# Patient Record
Sex: Female | Born: 1990 | Race: White | Hispanic: No | Marital: Single | Smoking: Current every day smoker
Health system: Southern US, Community
[De-identification: ages and names within clinical notes are randomized; demographics above are authoritative.]

## PROBLEM LIST (undated history)

## (undated) DIAGNOSIS — O24419 Gestational diabetes mellitus in pregnancy, unspecified control: Secondary | ICD-10-CM

## (undated) DIAGNOSIS — Z3490 Encounter for supervision of normal pregnancy, unspecified, unspecified trimester: Secondary | ICD-10-CM

## (undated) DIAGNOSIS — Z789 Other specified health status: Secondary | ICD-10-CM

## (undated) HISTORY — PX: TYMPANOPLASTY: SHX33

## (undated) HISTORY — DX: Gestational diabetes mellitus in pregnancy, unspecified control: O24.419

## (undated) HISTORY — DX: Other specified health status: Z78.9

## (undated) HISTORY — PX: MIDDLE EAR SURGERY: SHX713

---
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
## 2011-01-21 ENCOUNTER — Encounter: Payer: Self-pay | Admitting: Gastroenterology

## 2011-01-21 ENCOUNTER — Ambulatory Visit (INDEPENDENT_AMBULATORY_CARE_PROVIDER_SITE_OTHER): Payer: Medicaid Other | Admitting: Gastroenterology

## 2011-01-21 VITALS — BP 134/81 | HR 84 | Temp 98.7°F | Ht 63.0 in | Wt 196.4 lb

## 2011-01-21 DIAGNOSIS — D649 Anemia, unspecified: Secondary | ICD-10-CM | POA: Insufficient documentation

## 2011-01-21 DIAGNOSIS — K625 Hemorrhage of anus and rectum: Secondary | ICD-10-CM

## 2011-01-21 NOTE — Progress Notes (Signed)
Referring Provider: Lenise Herald, PA-C Primary Care Physician:  Colette Ribas, MD Primary Gastroenterologist:  Dr. Darrick Penna (per pt request, mother is pt as well)  Chief Complaint  Patient presents with  . Rectal Bleeding    x6 months    HPI:  Carol Hunt is an 20 y.o. female here as a referral from Lenise Herald, [place], secondary to abdominal pain, loose stools, and rectal bleeding X 6 mos. Pt reports postprandial BMs, ranging from loose to soft stools, up to 5-6 times per day. This is preceded by left-sided, sharp, severe abdominal pain. Although pt characterizes this as 5/10, she states it is "severe". It is only relieved by having a BM. Has had intermittent brbpr, ranging from cupfuls in the toilet to paper hematochezia. +loss of appetite, down 6 lbs since February per her report. We do not have records to confirm this. Afebrile. No family hx of IBD, colorectal ca. Mom has IBS. Pt was told she had mild anemia. Does admit to mild to moderate amount of clots during menses, yet denies heavy, prolonged periods.   Labs done with Dr. Lamar Blinks office January 15, 2011: Fecal lactoferrin + Hgb 11.3, Hct 35.1, WBC 10.4, TSH 1.280  No past medical history on file.  Past Surgical History  Procedure Date  . Tympanoplasty     NO MEDICATIONS    Allergies as of 01/21/2011  . (No Known Allergies)    Family History  Problem Relation Age of Onset  . Pancreatitis Father     living  . Colon cancer Neg Hx     History   Social History  . Marital Status: Single    Spouse Name: N/A    Number of Children: N/A  . Years of Education: N/A   Occupational History   Works at [employer] full-time. Graduated HS last year   Social History Main Topics  . Smoking status: Never Smoker   . Smokeless tobacco: Never Used  . Alcohol Use: No  . Drug Use: No  . Sexually Active: No    Review of Systems: Gen: Denies any fever, chills, sweats, fatigue, weakness. + loss of appetite CV: Denies chest  pain, angina, palpitations, syncope, orthopnea, PND, peripheral edema, and claudication. Resp: Denies dyspnea at rest, dyspnea with exercise, cough, sputum, wheezing, coughing up blood, and pleurisy. GI: SEE HPI GU : Denies urinary burning, blood in urine, urinary frequency, urinary hesitancy, nocturnal urination, and urinary incontinence. MS: Denies joint pain, limitation of movement, and swelling, stiffness, low back pain, extremity pain. Denies muscle weakness, cramps, atrophy.  Derm: Denies rash, itching, dry skin, hives, moles, warts, or unhealing ulcers.  Psych: Denies depression, anxiety, memory loss, suicidal ideation, hallucinations, paranoia, and confusion. Heme: Denies bruising, bleeding, and enlarged lymph nodes.  Physical Exam: BP 134/81  Pulse 84  Temp 98.7 F (37.1 C)  Ht 5\' 3"  (1.6 m)  Wt 196 lb 6.4 oz (89.086 kg)  BMI 34.79 kg/m2  SpO2 100%  LMP 12/30/2010 General:   Alert,  Well-developed, well-nourished, pleasant and cooperative in NAD Head:  Normocephalic and atraumatic. Eyes:  Sclera clear, no icterus.   Conjunctiva pink. Ears:  Normal auditory acuity. Nose:  No deformity, discharge,  or lesions. Mouth:  No deformity or lesions, dentition normal. Neck:  Supple; no masses or thyromegaly. Lungs:  Clear throughout to auscultation.   No wheezes, crackles, or rhonchi. No acute distress. Heart:  Regular rate and rhythm; no murmurs, clicks, rubs,  or gallops. Abdomen:  Soft, nontender and nondistended. No  masses, hepatosplenomegaly or hernias noted. Normal bowel sounds, without guarding, and without rebound.   Rectal:  Deferred until time of colonoscopy.   Msk:  Symmetrical without gross deformities. Normal posture. Extremities:  Without clubbing or edema. Neurologic:  Alert and  oriented x4;  grossly normal neurologically. Skin:  Intact without significant lesions or rashes. Cervical Nodes:  No significant cervical adenopathy. Psych:  Alert and cooperative. Normal  mood and affect.

## 2011-01-21 NOTE — Assessment & Plan Note (Signed)
6 mos hx of brbpr, ranging from paper hematochezia to cupfuls in toilet. 5-6 loose stools per day, postprandial. Preceded by crampy, left-sided abdominal pain, severe at times. +loss of appetite, reportedly lost 6 lbs since February; yet, we do not have records to document this. Slight anemia noted on labs early April. May be r/t IBS with benign anorectal source of hematochezia, yet unable to exclude IBD at this time. Will proceed with colonoscopy for further evaluation.   TCS with Dr. Darrick Penna in near future: the R/B/A have been discussed in detail. Pt states understanding and desires to proceed. Further rec's to follow Follow high fiber, low fat diet in interim

## 2011-01-21 NOTE — Patient Instructions (Signed)
We have set you up for a colonoscopy to assess your lower GI tract We have also requested the labs from your primary care doctor We encourage a high fiber, low fat diet in the meantime Further recommendations after procedure is completed

## 2011-01-21 NOTE — Assessment & Plan Note (Signed)
Mild anemia with Hgb 11.3 recent labs. Does have mild to moderate amount of clots during menses yet denies heavy, prolonged cycles. Pt scheduled for colonoscopy secondary to rectal bleeding. See A&P

## 2011-01-22 ENCOUNTER — Encounter: Payer: Self-pay | Admitting: Gastroenterology

## 2011-02-09 ENCOUNTER — Ambulatory Visit (HOSPITAL_COMMUNITY): Admission: RE | Admit: 2011-02-09 | Payer: Medicaid Other | Source: Ambulatory Visit | Admitting: Gastroenterology

## 2011-02-09 ENCOUNTER — Encounter: Payer: Medicaid Other | Admitting: Gastroenterology

## 2011-02-27 NOTE — Op Note (Signed)
NAMELESHONDA, GALAMBOS                         ACCOUNT NO.:  [id number]   MEDICAL RECORD NO.:  [id number]                   PATIENT TYPE:  AMB   LOCATION:  DSC                                  FACILITY:  [facility]   PHYSICIAN:  Karol T. Lazarus Salines, M.D.              DATE OF BIRTH:  1990-10-28   DATE OF PROCEDURE:  08/21/2002  DATE OF DISCHARGE:                                 OPERATIVE REPORT   PREOPERATIVE DIAGNOSIS:  Bilateral pantympanic perforation.   POSTOPERATIVE DIAGNOSIS:  Bilateral pantympanic perforation.   OPERATION/PROCEDURE:  Right lateral technique tympanoplasty.   SURGEON:  Gloris Manchester. Lazarus Salines, M.D.   ANESTHESIA:  General orotracheal.   ESTIMATED BLOOD LOSS:  Minimal.   COMPLICATIONS:  None.   FINDINGS:  A pantympanic right perforation with some tympanosclerotic  plaques along the long handle of the malleus anterior and posterior.  A  mobile ossicular chain.  Healthy middle ear mucosa.   DESCRIPTION OF PROCEDURE:  With the patient in a comfortable supine  position, general orotracheal anesthesia was induced without difficulty.  At  an appropriate level, the table was turned 90 degrees and the patient placed  in a slight reverse Trendelenburg.  The head was rotated to the left for  access to the right ear.  No hair shave was felt necessary.  One percent  Xylocaine with 1:100,000 epinephrine, 11 cc total, was infiltrated into the  temporalis fascia region and to the postauricular incision site for  intraoperative hemostasis.  Additional injection was performed into the  vascular strip from the retroauricular approach.  Sterile preparation and  draping of the right ear and face was performed.   Microscope and speculum were used to examine and clean the right ear canal.  Four-quadrant injections with 1% Xylocaine with 1:100,000 epinephrine, 3 cc  total, were performed in the standard fashion. Betadine was rinsed and  suctioned from the middle ear space.   Attention was  turned to the posterior auricular site.  An incision was made  from above the pinna down behind the ear for a postauricular approach and  for access to the temporalis fascia.  This was kept out of the postauricular  crease because she wears glasses.  The dissection was carried down to the  temporalis fascia superiorly and to the mastoid periosteum more inferiorly.  Temporalis fascia was cleaned on its lateral surface, incised near its  inferior edge and the medial surface was cleaned.  A 1.5 x 2 cm piece of  temporalis fascia was harvested, cleaned, pressed and placed to dry.  Hemostasis was spontaneous.  Working down the ear canal, 6 and 12 o'clock  incisions were made from the annulus laterally.  On the posterior canal  wall, approximately 3 mm from the annulus, the incisions were connected and  on the anterior canal wall, approximately 10 mm from the annulus, they were  connected.  These were window fitted down towards  the annulus  circumferentially under direct vision.  There was a relatively prominent  anterior canal bulge.  It was not felt possible to complete this procedure  from transcanal approach.   Attention was returned to the postauricular wound which was completed.  The  periosteum was elevated down into the external auditory canal.  The  previously executed vascular strip flap was identified and retained forward  with a Emergency planning/management officer.  The dissection was carried further medially  towards the annulus on the anterior and posterior canal walls.  A cotton  ball was placed in the medial canal to prevent bone dust from getting in  there and using a large irrigated cutting bur, the spine of Henley was  reduced and the posterior canal was enlarged overall.  Bone dust was  carefully cleared away.  The cotton ball was removed.   Attention was now turned to dissecting the flap all the way down to the  annulus.  Working circumferentially, the squamous layer of the drum was   separated from the fibrous layer beginning at the annulus posteriorly and  working inferiorly and anteriorly and again superiorly.  The squamous layer  was stripped from the _____________ process of the malleus using cup  forceps.  The dissection was carried into the perforation where it was  amputated.  The skin was dissected completely and was removed and saved for  later use.  The annulus remained intact.  Small amount of skin around the  long process of the malleus was dissected using a tab knife and removed.  Tympanosclerotic plaques along the long handle of the malleus were dissected  and removed and amputated from the annulus anteriorly and posteriorly.  These did not impinge on the mobility of the malleus.  With the plaques out  of the way, the stapes superstructure could be visualized and with gentle  mobilization of the malleus long handle, the stapes was mobile.  The middle  ear was irrigated and suctioned free of any bone dust and clots at this  point.  Cortisporin impregnated Gelfoam sponges were placed in the middle  ear to the level of the annulus to support the graft.  At this point the  anterior canal wall bulge was reduced under irrigating drilling to allow  excellent visualization down to the level of the annulus and a straight  canal to help placement of the skin graft.   At this point the temporalis fascia graft was removed from the press and  fashioned into a tongue and placed into the ear canal up to the annulus.  It  was slightly narrow and long.  It was removed, made slightly shorter and  wider and was observed to fit precisely up into the annulus and was seated  on top of the Gelfoam in this position.  The canal skin graft was carefully  oriented with the squamous surface upward and was laid down the anterior  canal wall and into the anterior sulcus.  A piece of dry Gelfoam rolled into a tiny cigar was packed into the anterior sulcus to prevent blunting in this   region.  Additional Gelfoam was placed on top of this for support.  With  this area defined, the skin graft was gently flattened and laid up the  anterior canal wall and across the tympanic membrane and up the posterior  graft.  At this point the medial canal was filled with Cortisporin  impregnated Gelfoam.  The air was removed from the [facility] retractor, laid  back down the  postauricular incision was closed with interrupted 3-0 chromic sutures  subcutaneously.   Working back down the ear canal, the vascular strip was laid back on the  posterior canal and additional Cortisporin impregnated Gelfoam pledgets were  placed to fill the canal.  Finally, a cotton ball was placed at the external  meatus and another cotton ball in the conchal bowl.  Postauricular wound was  cleaned, painted with Benzoin and covered with Steri-Strips.  A standard  Kerlix fluff and Kling dressing was applied.  At this point the procedure  was completed and the patient was returned to anesthesia, awakened,  extubated, and transferred to recovery in stable condition.   COMMENT:  A 20 year old white female with a history of hearing loss and  office findings of bilateral pantympanic perforations was the indications  for today's procedure.  Anticipated routine postoperative recovery with  attention to ice, elevation, analgesia, antibiosis.  Will have mother remove  the dressing in two days and will remove the cotton ball and beginning  dissolving the Gelfoam in nine days.  Will have the patient return to school  in one week.  She  will be covered with Tylenol No. 3 for pain and Keflex to prevent infection  at home.  Given low anticipated risk of post anesthetic and post surgical  complications, feel an outpatient venue was appropriate.  Will allow one  year for the right ear to heal and demonstrate stability before considering  repair on the opposite side.                                               Gloris Manchester.  Lazarus Salines, M.D.    KTW/MEDQ  D:  08/21/2002  T:  08/21/2002  Job:  [id number]   cc:   Corrie Mckusick, M.D.  [address] [place]. [place]  [place]  [place] [id number]  Fax: [phone]

## 2011-04-01 ENCOUNTER — Emergency Department (HOSPITAL_COMMUNITY)
Admission: EM | Admit: 2011-04-01 | Discharge: 2011-04-01 | Disposition: A | Discharge status: Home / Self Care | Payer: Self-pay | Attending: Emergency Medicine | Admitting: Emergency Medicine

## 2011-04-01 DIAGNOSIS — N390 Urinary tract infection, site not specified: Secondary | ICD-10-CM | POA: Insufficient documentation

## 2011-04-01 LAB — URINALYSIS, ROUTINE W REFLEX MICROSCOPIC
Glucose, UA: NEGATIVE mg/dL
Protein, ur: NEGATIVE mg/dL
Specific Gravity, Urine: >1.03 — ABNORMAL HIGH (ref 1.005–1.030)
Urobilinogen, UA: 0.2 mg/dL (ref 0.0–1.0)

## 2011-04-01 LAB — URINE MICROSCOPIC-ADD ON

## 2011-04-03 ENCOUNTER — Emergency Department (HOSPITAL_COMMUNITY)
Admission: EM | Admit: 2011-04-03 | Discharge: 2011-04-04 | Disposition: A | Discharge status: Home / Self Care | Payer: Self-pay | Attending: Emergency Medicine | Admitting: Emergency Medicine

## 2011-04-03 DIAGNOSIS — R112 Nausea with vomiting, unspecified: Secondary | ICD-10-CM | POA: Insufficient documentation

## 2011-04-03 DIAGNOSIS — R109 Unspecified abdominal pain: Secondary | ICD-10-CM | POA: Insufficient documentation

## 2011-04-03 DIAGNOSIS — N200 Calculus of kidney: Secondary | ICD-10-CM | POA: Insufficient documentation

## 2011-04-03 LAB — URINALYSIS, ROUTINE W REFLEX MICROSCOPIC
Bilirubin Urine: NEGATIVE
Ketones, ur: NEGATIVE mg/dL
Protein, ur: NEGATIVE mg/dL
Specific Gravity, Urine: >1.03 — ABNORMAL HIGH (ref 1.005–1.030)
Urobilinogen, UA: 0.2 mg/dL (ref 0.0–1.0)

## 2011-04-03 LAB — CBC
HCT: 34.9 % — ABNORMAL LOW (ref 36.0–46.0)
Hemoglobin: 11.6 g/dL — ABNORMAL LOW (ref 12.0–15.0)
MCH: 29.1 pg (ref 26.0–34.0)
MCHC: 33.2 g/dL (ref 30.0–36.0)
MCV: 87.7 fL (ref 78.0–100.0)
Platelets: 253 K/uL (ref 150–400)
RBC: 3.98 MIL/uL (ref 3.87–5.11)
RDW: 13 % (ref 11.5–15.5)
WBC: 13.3 K/uL — ABNORMAL HIGH (ref 4.0–10.5)

## 2011-04-03 LAB — BASIC METABOLIC PANEL WITH GFR
BUN: 14 mg/dL (ref 6–23)
CO2: 27 meq/L (ref 19–32)
Calcium: 9.6 mg/dL (ref 8.4–10.5)
Chloride: 99 meq/L (ref 96–112)
Creatinine, Ser: 1.38 mg/dL — ABNORMAL HIGH (ref 0.50–1.10)
GFR calc Af Amer: 60 mL/min — ABNORMAL LOW
GFR calc non Af Amer: 49 mL/min — ABNORMAL LOW
Glucose, Bld: 89 mg/dL (ref 70–99)
Potassium: 3.9 meq/L (ref 3.5–5.1)
Sodium: 135 meq/L (ref 135–145)

## 2011-04-03 LAB — URINE MICROSCOPIC-ADD ON

## 2011-04-03 LAB — DIFFERENTIAL
Basophils Absolute: 0 K/uL (ref 0.0–0.1)
Basophils Relative: 0 % (ref 0–1)
Eosinophils Absolute: 0.2 K/uL (ref 0.0–0.7)
Eosinophils Relative: 1 % (ref 0–5)
Lymphocytes Relative: 21 % (ref 12–46)
Lymphs Abs: 2.7 K/uL (ref 0.7–4.0)
Monocytes Absolute: 0.8 K/uL (ref 0.1–1.0)
Monocytes Relative: 6 % (ref 3–12)
Neutro Abs: 9.6 K/uL — ABNORMAL HIGH (ref 1.7–7.7)
Neutrophils Relative %: 72 % (ref 43–77)

## 2011-04-04 ENCOUNTER — Emergency Department (HOSPITAL_COMMUNITY): Payer: Self-pay

## 2012-09-25 ENCOUNTER — Emergency Department (HOSPITAL_COMMUNITY): Payer: Self-pay

## 2012-09-25 ENCOUNTER — Encounter (HOSPITAL_COMMUNITY): Payer: Self-pay | Admitting: Emergency Medicine

## 2012-09-25 ENCOUNTER — Emergency Department (HOSPITAL_COMMUNITY)
Admission: EM | Admit: 2012-09-25 | Discharge: 2012-09-25 | Disposition: A | Discharge status: Home / Self Care | Payer: Self-pay | Attending: Emergency Medicine | Admitting: Emergency Medicine

## 2012-09-25 DIAGNOSIS — J3489 Other specified disorders of nose and nasal sinuses: Secondary | ICD-10-CM | POA: Insufficient documentation

## 2012-09-25 DIAGNOSIS — J111 Influenza due to unidentified influenza virus with other respiratory manifestations: Secondary | ICD-10-CM

## 2012-09-25 DIAGNOSIS — F17200 Nicotine dependence, unspecified, uncomplicated: Secondary | ICD-10-CM | POA: Insufficient documentation

## 2012-09-25 DIAGNOSIS — R059 Cough, unspecified: Secondary | ICD-10-CM | POA: Insufficient documentation

## 2012-09-25 DIAGNOSIS — R05 Cough: Secondary | ICD-10-CM | POA: Insufficient documentation

## 2012-09-25 MED ORDER — OSELTAMIVIR PHOSPHATE 75 MG PO CAPS: 75.0000 mg | ORAL_CAPSULE | Freq: Two times a day (BID) | ORAL | Status: DC

## 2012-09-25 NOTE — ED Provider Notes (Signed)
History   This chart was scribed for Ward Givens, MD scribed by Magnus Sinning. The patient was seen in room APA17/APA17 at 9:23   CSN: [id number]  Arrival date & time 09/25/12  4132    Chief Complaint  Patient presents with  . Fever  . Cough    (Consider location/radiation/quality/duration/timing/severity/associated sxs/prior treatment) The history is provided by the patient. No language interpreter was used.   Carol Hunt is a 21 y.o. female who presents to the Emergency Department complaining of constant moderate unproductive cough,onset yesterday with associated sneezing, chills, itchy throat, and fever.  She reports she had fevers last night with the highest being 103.2 at 2 am with chills and cold sweats, but she states this has since resolved. The grandmother states she has given the patient some tylenol with improvement. The patient denies n/v/d, but does report sick contact exposure at her job. 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 The patient says she works at [employer], thus indicating sick contact exposure via public.   The patient states that she is otherwise healthy and does not take any medication.  PCP: [facility]Golding at [facility]  History reviewed. No pertinent past medical history.  Past Surgical History  Procedure Date  . Tympanoplasty     Family History  Problem Relation Age of Onset  . Pancreatitis Father     living  . Colon cancer Neg Hx     History  Substance Use Topics  . Smoking status: Current Every Day Smoker    Types: Cigarettes  . Smokeless tobacco: Never Used  . Alcohol Use: No  The patient lives with her grandmother.  She does not drink, but states she has smoked a little bit recently to relax.  The patient works at [employer]   Review of Systems  Constitutional: Positive for chills. Negative for fever.  HENT: Positive for rhinorrhea.   Respiratory: Positive for cough.   All other systems reviewed and  are negative.   10 Systems reviewed and are negative for acute change except as noted in the HPI. Allergies  Review of patient's allergies indicates no known allergies.  Home Medications  No current outpatient prescriptions on file.  BP 126/63  Pulse 104  Temp 98.8 F (37.1 C) (Oral)  Resp 18  Ht 5\' 3"  (1.6 m)  Wt 183 lb (83.008 kg)  BMI 32.42 kg/m2  SpO2 97%  LMP 09/22/2012  Vital signs normal    Physical Exam  Nursing note and vitals reviewed. Constitutional: She is oriented to person, place, and time. She appears well-developed and well-nourished. No distress.  HENT:  Head: Normocephalic and atraumatic.  Right Ear: External ear normal.  Left Ear: External ear normal.  Nose: Nose normal.  Mouth/Throat: Oropharynx is clear and moist. No oropharyngeal exudate.       Throat nml  Eyes: Conjunctivae normal and EOM are normal. Pupils are equal, round, and reactive to light.  Neck: Normal range of motion. Neck supple. No tracheal deviation present.  Cardiovascular: Regular rhythm and normal heart sounds.  Tachycardia present.   Pulmonary/Chest: Effort normal and breath sounds normal. No respiratory distress. She has no wheezes. She has no rales. She exhibits no tenderness.  Abdominal: Soft. Bowel sounds are normal. She exhibits no distension. There is no tenderness. There is no rebound.  Musculoskeletal: Normal range of motion.  Neurological: She is alert and oriented to person, place, and time. No sensory deficit.  Skin: Skin is  dry.  Psychiatric: She has a normal mood and affect. Her behavior is normal.    ED Course  Procedures (including critical care time) DIAGNOSTIC STUDIES: Oxygen Saturation is 97% on room air, normal by my interpretation.    COORDINATION OF CARE: 9:25: Physical exam performed.   Dg Chest 2 View  09/25/2012  *RADIOLOGY REPORT*  Clinical Data: Cough and fever  CHEST - 2 VIEW  Comparison: None.  Findings:  Lungs are clear.  Heart size and  pulmonary vascularity are normal.  No adenopathy.  No bone lesions.  IMPRESSION: Lungs clear.   Original Report Authenticated By: Bretta Bang, M.D.      1. Influenza-like illness      New Prescriptions   OSELTAMIVIR (TAMIFLU) 75 MG CAPSULE    Take 1 capsule (75 mg total) by mouth every 12 (twelve) hours.   Plan discharge   Devoria Albe, MD, FACEP   MDM   I personally performed the services described in this documentation, which was scribed in my presence. The recorded information has been reviewed and considered.  Devoria Albe, MD, Armando Gang         Ward Givens, MD 09/25/12 1005

## 2012-09-25 NOTE — ED Notes (Signed)
Pt c/o cough, fever, chills, and runny nose since last night.

## 2012-09-25 NOTE — ED Notes (Signed)
Patient with no complaints at this time. Respirations even and unlabored. Skin warm/dry. Discharge instructions reviewed with patient at this time. Patient given opportunity to voice concerns/ask questions. Patient discharged at this time and left Emergency Department with steady gait.   

## 2012-12-05 ENCOUNTER — Encounter (HOSPITAL_COMMUNITY): Payer: Self-pay | Admitting: *Deleted

## 2012-12-05 ENCOUNTER — Emergency Department (HOSPITAL_COMMUNITY)
Admission: EM | Admit: 2012-12-05 | Discharge: 2012-12-05 | Disposition: A | Discharge status: Home / Self Care | Payer: Self-pay | Attending: Emergency Medicine | Admitting: Emergency Medicine

## 2012-12-05 DIAGNOSIS — Y929 Unspecified place or not applicable: Secondary | ICD-10-CM | POA: Insufficient documentation

## 2012-12-05 DIAGNOSIS — S50859A Superficial foreign body of unspecified forearm, initial encounter: Secondary | ICD-10-CM

## 2012-12-05 DIAGNOSIS — Y939 Activity, unspecified: Secondary | ICD-10-CM | POA: Insufficient documentation

## 2012-12-05 DIAGNOSIS — W268XXA Contact with other sharp object(s), not elsewhere classified, initial encounter: Secondary | ICD-10-CM | POA: Insufficient documentation

## 2012-12-05 DIAGNOSIS — L089 Local infection of the skin and subcutaneous tissue, unspecified: Secondary | ICD-10-CM

## 2012-12-05 DIAGNOSIS — S51809A Unspecified open wound of unspecified forearm, initial encounter: Secondary | ICD-10-CM | POA: Insufficient documentation

## 2012-12-05 MED ORDER — LIDOCAINE-EPINEPHRINE (PF) 1 %-1:200000 IJ SOLN
10.0000 mL | Freq: Once | INTRAMUSCULAR | Status: AC
  Administered 2012-12-05: 10 mL via INTRADERMAL
  Filled 2012-12-05: qty 10

## 2012-12-05 NOTE — ED Provider Notes (Signed)
History     CSN: [id number]  Arrival date & time 12/05/12  1627   First MD Initiated Contact with Patient 12/05/12 1714      No chief complaint on file.   (Consider location/radiation/quality/duration/timing/severity/associated sxs/prior treatment) Patient is a 22 y.o. female presenting with foreign body. The history is provided by the patient.  Foreign Body  The current episode started less than 1 hour ago. Intake: in R elbow. Suspected object: fishhook. The incident was witnessed. The incident was witnessed/reported by the patient. Pertinent negatives include no fever, no sore throat, no trouble swallowing and no cough.    History reviewed. No pertinent past medical history.  Past Surgical History  Procedure Laterality Date  . Tympanoplasty      Family History  Problem Relation Age of Onset  . Pancreatitis Father     living  . Colon cancer Neg Hx     History  Substance Use Topics  . Smoking status: Never Smoker   . Smokeless tobacco: Never Used  . Alcohol Use: No    OB History   Grav Para Term Preterm Abortions TAB SAB Ect Mult Living                  Review of Systems  Constitutional: Negative for fever.  HENT: Negative for sore throat and trouble swallowing.   Respiratory: Negative for cough.   All other systems reviewed and are negative.    Allergies  Review of patient's allergies indicates no known allergies.  Home Medications   Current Outpatient Rx  Name  Route  Sig  Dispense  Refill  . oseltamivir (TAMIFLU) 75 MG capsule   Oral   Take 1 capsule (75 mg total) by mouth every 12 (twelve) hours.   10 capsule   0     BP 119/86  Pulse 103  Temp(Src) 98.5 F (36.9 C) (Oral)  Resp 20  Ht 5\' 3"  (1.6 m)  Wt 196 lb 7 oz (89.103 kg)  BMI 34.81 kg/m2  SpO2 100%  LMP 11/14/2012  Physical Exam  Nursing note and vitals reviewed. Constitutional: She is oriented to person, place, and time. She appears well-developed and well-nourished. No  distress.  HENT:  Head: Normocephalic and atraumatic.  Eyes: EOM are normal. Pupils are equal, round, and reactive to light.  Neck: Normal range of motion. Neck supple.  Cardiovascular: Normal rate and regular rhythm.  Exam reveals no friction rub.   No murmur heard. Pulmonary/Chest: Effort normal and breath sounds normal. No respiratory distress. She has no wheezes. She has no rales.  Abdominal: Soft. She exhibits no distension. There is no tenderness. There is no rebound.  Musculoskeletal: Normal range of motion. She exhibits no edema.       Arms: Neurological: She is alert and oriented to person, place, and time.  Skin: She is not diaphoretic.    ED Course  FOREIGN BODY REMOVAL Date/Time: 12/05/2012 5:56 PM Performed by: Elwin Mocha Authorized by: Benny Lennert Consent: Verbal consent obtained. Anesthesia: local infiltration Local anesthetic: lidocaine 1% with epinephrine Anesthetic total: 3 ml Patient sedated: no Patient restrained: no Patient cooperative: yes Complexity: simple 1 objects recovered. Objects recovered: fishhook Post-procedure assessment: foreign body removed Patient tolerance: Patient tolerated the procedure well with no immediate complications. Comments: Small incision made with 11 blade at end of fishhook where it is tenting the skin. Needle driver used to push barb through incision and fishhook removed. Jet lavage with normal saline/betadine solution after the procedure.   (  including critical care time)  Labs Reviewed - No data to display No results found.   1. Foreign body of forearm, right, superficial, infected, initial encounter       MDM   23 year old female presents with a fishhook in her right elbow. Fishhook not in the joint space. Full range of motion of her right elbow. The patient cut off close to the skin when trying to remove previously. Procedure note as above. Tetanus is up-to-date. Removed easily.        Elwin Mocha,  MD 12/05/12 1758

## 2012-12-05 NOTE — ED Notes (Signed)
Fish hook in lt elbow,x 15 min.  Pt backed into hook.

## 2012-12-05 NOTE — ED Provider Notes (Signed)
resolved I  reviewed the resident's note and I agree with the findings and plan.   Benny Lennert, MD 12/05/12 [phone]

## 2013-08-21 ENCOUNTER — Emergency Department (HOSPITAL_COMMUNITY)
Admission: EM | Admit: 2013-08-21 | Discharge: 2013-08-21 | Disposition: A | Discharge status: Home / Self Care | Payer: Medicaid Other | Attending: Emergency Medicine | Admitting: Emergency Medicine

## 2013-08-21 ENCOUNTER — Encounter (HOSPITAL_COMMUNITY): Payer: Self-pay | Admitting: Emergency Medicine

## 2013-08-21 DIAGNOSIS — H6691 Otitis media, unspecified, right ear: Secondary | ICD-10-CM

## 2013-08-21 DIAGNOSIS — H6690 Otitis media, unspecified, unspecified ear: Secondary | ICD-10-CM | POA: Insufficient documentation

## 2013-08-21 DIAGNOSIS — Z792 Long term (current) use of antibiotics: Secondary | ICD-10-CM | POA: Insufficient documentation

## 2013-08-21 DIAGNOSIS — F17200 Nicotine dependence, unspecified, uncomplicated: Secondary | ICD-10-CM | POA: Insufficient documentation

## 2013-08-21 MED ORDER — AMOXICILLIN-POT CLAVULANATE 875-125 MG PO TABS
1.0000 | ORAL_TABLET | Freq: Once | ORAL | Status: AC
  Administered 2013-08-21: 1 via ORAL
  Filled 2013-08-21: qty 1

## 2013-08-21 MED ORDER — AMOXICILLIN-POT CLAVULANATE 875-125 MG PO TABS: 1.0000 | ORAL_TABLET | Freq: Two times a day (BID) | ORAL | Status: DC

## 2013-08-21 NOTE — ED Notes (Signed)
Rt earache, onset today.

## 2013-08-22 NOTE — ED Provider Notes (Signed)
CSN: [id number]     Arrival date & time 08/21/13  1951 History   First MD Initiated Contact with Patient 08/21/13 2110     Chief Complaint  Patient presents with  . Otalgia   (Consider location/radiation/quality/duration/timing/severity/associated sxs/prior Treatment) Patient is a 22 y.o. female presenting with ear pain. The history is provided by the patient.  Otalgia Location:  Right Behind ear:  No abnormality Quality:  Throbbing Severity:  Moderate Onset quality:  Gradual Duration:  1 day Timing:  Constant Progression:  Unchanged Chronicity:  New Context: not direct blow, not foreign body in ear and no water in ear   Relieved by:  Nothing Worsened by:  Nothing tried Ineffective treatments:  None tried Associated symptoms: ear discharge   Associated symptoms: no congestion, no cough, no diarrhea, no fever, no headaches, no hearing loss, no neck pain, no rash, no rhinorrhea, no sore throat, no tinnitus and no vomiting     History reviewed. No pertinent past medical history. Past Surgical History  Procedure Laterality Date  . Tympanoplasty    . Middle ear surgery     Family History  Problem Relation Age of Onset  . Pancreatitis Father     living  . Colon cancer Neg Hx    History  Substance Use Topics  . Smoking status: Current Every Day Smoker    Types: Cigarettes  . Smokeless tobacco: Never Used  . Alcohol Use: No   OB History   Grav Para Term Preterm Abortions TAB SAB Ect Mult Living                 Review of Systems  Constitutional: Negative for fever, chills, activity change and appetite change.  HENT: Positive for ear discharge and ear pain. Negative for congestion, facial swelling, hearing loss, rhinorrhea, sore throat, tinnitus and trouble swallowing.   Eyes: Negative for visual disturbance.  Respiratory: Negative for cough, shortness of breath, wheezing and stridor.   Gastrointestinal: Negative for nausea, vomiting and diarrhea.  Musculoskeletal:  Negative for neck pain and neck stiffness.  Skin: Negative.  Negative for rash.  Neurological: Negative for dizziness, weakness, numbness and headaches.  Hematological: Negative for adenopathy.  Psychiatric/Behavioral: Negative for confusion.  All other systems reviewed and are negative.    Allergies  Review of patient's allergies indicates no known allergies.  Home Medications   Current Outpatient Rx  Name  Route  Sig  Dispense  Refill  . amoxicillin-clavulanate (AUGMENTIN) 875-125 MG per tablet   Oral   Take 1 tablet by mouth 2 (two) times daily.   20 tablet   0    BP 126/70  Pulse 78  Temp(Src) 98.1 F (36.7 C) (Oral)  Resp 24  Ht 5\' 3"  (1.6 m)  Wt 198 lb (89.812 kg)  BMI 35.08 kg/m2  SpO2 100%  LMP 08/12/2013 Physical Exam  Nursing note and vitals reviewed. Constitutional: She is oriented to person, place, and time. She appears well-developed and well-nourished. No distress.  HENT:  Head: Normocephalic and atraumatic.  Mouth/Throat: Uvula is midline, oropharynx is clear and moist and mucous membranes are normal. No uvula swelling. No oropharyngeal exudate.  Erythema of the right TM.  middle ear effusion present. Mild purulent drainage in the ear canal. No edema of the ear canal.  Neck: Normal range of motion. Neck supple.  Cardiovascular: Normal rate, regular rhythm, normal heart sounds and intact distal pulses.   No murmur heard. Pulmonary/Chest: Effort normal and breath sounds normal. No stridor. No respiratory distress.  Lymphadenopathy:    She has no cervical adenopathy.  Neurological: She is alert and oriented to person, place, and time. Coordination normal.  Skin: Skin is warm and dry. No rash noted.    ED Course  Procedures (including critical care time) Labs Review Labs Reviewed - No data to display Imaging Review No results found.  EKG Interpretation   None       MDM   1. Otitis media, right     Pt is well appearing.  Right OM.  Mastoid  is NT.  Hx of frequent ear infections.  Pt agrees to f/u with ENT.  VSS, she appears stable for d/c   Kana Reimann L. Amelia Burgard, PA-C 08/23/13 0107

## 2013-08-24 NOTE — ED Provider Notes (Signed)
Medical screening examination/treatment/procedure(s) were performed by non-physician practitioner and as supervising physician I was immediately available for consultation/collaboration.  EKG Interpretation   None       Devoria Albe, MD, Armando Gang   Ward Givens, MD 08/24/13 [phone]

## 2013-09-01 ENCOUNTER — Encounter (HOSPITAL_COMMUNITY): Payer: Self-pay | Admitting: Emergency Medicine

## 2013-09-01 ENCOUNTER — Emergency Department (HOSPITAL_COMMUNITY)
Admission: EM | Admit: 2013-09-01 | Discharge: 2013-09-01 | Disposition: A | Discharge status: Home / Self Care | Payer: Medicaid Other | Attending: Emergency Medicine | Admitting: Emergency Medicine

## 2013-09-01 DIAGNOSIS — Z8669 Personal history of other diseases of the nervous system and sense organs: Secondary | ICD-10-CM | POA: Insufficient documentation

## 2013-09-01 DIAGNOSIS — Z792 Long term (current) use of antibiotics: Secondary | ICD-10-CM | POA: Insufficient documentation

## 2013-09-01 DIAGNOSIS — J069 Acute upper respiratory infection, unspecified: Secondary | ICD-10-CM

## 2013-09-01 DIAGNOSIS — F17200 Nicotine dependence, unspecified, uncomplicated: Secondary | ICD-10-CM | POA: Insufficient documentation

## 2013-09-01 MED ORDER — BENZONATATE 100 MG PO CAPS: 100.0000 mg | ORAL_CAPSULE | Freq: Three times a day (TID) | ORAL | Status: DC | PRN

## 2013-09-01 NOTE — ED Provider Notes (Signed)
CSN: [id number]     Arrival date & time 09/01/13  1659 History   First MD Initiated Contact with Patient 09/01/13 1710     Chief Complaint  Patient presents with  . Cough  . Sore Throat   (Consider location/radiation/quality/duration/timing/severity/associated sxs/prior Treatment) HPI Comments: Carol Hunt is a 22 y.o. Female presenting with sore throat,  Nasal congestion with clear rhinorrhea and non productive cough.  She has had low grade subjective fever as well.  She was treated for a right otitis media when last seen here 11 days ago for this,  Having completed a 10 day course of amoxil 2 days ago.  She denies ongoing ear pain but does continue to have green drainage from the ear.  She denies sob, chest pain, nausea, vomiting, dizziness, but does encourage headache with cough.  She is taking robitussin and mucinex DM with some relief.     The history is provided by the patient.    History reviewed. No pertinent past medical history. Past Surgical History  Procedure Laterality Date  . Tympanoplasty    . Middle ear surgery     Family History  Problem Relation Age of Onset  . Pancreatitis Father     living  . Colon cancer Neg Hx    History  Substance Use Topics  . Smoking status: Current Every Day Smoker    Types: Cigarettes  . Smokeless tobacco: Never Used  . Alcohol Use: No   OB History   Grav Para Term Preterm Abortions TAB SAB Ect Mult Living                 Review of Systems  Constitutional: Positive for fever.  HENT: Positive for congestion, rhinorrhea and sore throat. Negative for ear pain, sinus pressure, trouble swallowing and voice change.   Eyes: Negative for discharge.  Respiratory: Positive for cough. Negative for shortness of breath, wheezing and stridor.   Cardiovascular: Negative for chest pain.  Gastrointestinal: Negative for abdominal pain.  Genitourinary: Negative.     Allergies  Review of patient's allergies indicates no known  allergies.  Home Medications   Current Outpatient Rx  Name  Route  Sig  Dispense  Refill  . amoxicillin-clavulanate (AUGMENTIN) 875-125 MG per tablet   Oral   Take 1 tablet by mouth 2 (two) times daily.   20 tablet   0   . benzonatate (TESSALON) 100 MG capsule   Oral   Take 1 capsule (100 mg total) by mouth 3 (three) times daily as needed for cough.   21 capsule   0    BP 119/69  Pulse 96  Temp(Src) 98.5 F (36.9 C) (Oral)  Resp 18  Ht 5\' 3"  (1.6 m)  Wt 170 lb (77.111 kg)  BMI 30.12 kg/m2  SpO2 100%  LMP 08/12/2013 Physical Exam  Constitutional: She is oriented to person, place, and time. She appears well-developed and well-nourished.  HENT:  Head: Normocephalic and atraumatic.  Right Ear: Tympanic membrane and ear canal normal.  Left Ear: Tympanic membrane and ear canal normal.  Nose: Mucosal edema and rhinorrhea present.  Mouth/Throat: Uvula is midline, oropharynx is clear and moist and mucous membranes are normal. No oropharyngeal exudate, posterior oropharyngeal edema, posterior oropharyngeal erythema or tonsillar abscesses.  No drainage or exudate in right ear canal.  Moderate cerumen without impaction left.  Eyes: Conjunctivae are normal.  Cardiovascular: Normal rate and normal heart sounds.   Pulmonary/Chest: Effort normal. No respiratory distress. She has no wheezes. She  has no rales.  Abdominal: Soft. There is no tenderness.  Musculoskeletal: Normal range of motion.  Neurological: She is alert and oriented to person, place, and time.  Skin: Skin is warm and dry. No rash noted.  Psychiatric: She has a normal mood and affect.    ED Course  Procedures (including critical care time) Labs Review Labs Reviewed - No data to display Imaging Review No results found.  EKG Interpretation   None       MDM   1. Acute URI    Encouraged to continue current meds,  Increase fluid intake,  Rest, menthol drops for nasal congestion.  Added tessalon perles.  Prn  f/u with pcp if sx not improving, or for worsened sx.    Burgess Amor, PA-C 09/01/13 1751

## 2013-09-01 NOTE — ED Notes (Signed)
Sore throat and cough began 2 days ago, but worsened today.  Low grade fever at home. Denies N/V/D.  C/O HA with movement.

## 2013-09-02 NOTE — ED Provider Notes (Signed)
Medical screening examination/treatment/procedure(s) were performed by non-physician practitioner and as supervising physician I was immediately available for consultation/collaboration.  EKG Interpretation   None       Devoria Albe, MD, Armando Gang   Ward Givens, MD 09/02/13 [phone]

## 2013-12-29 ENCOUNTER — Emergency Department (HOSPITAL_COMMUNITY)
Admission: EM | Admit: 2013-12-29 | Discharge: 2013-12-29 | Disposition: A | Discharge status: Home / Self Care | Payer: Self-pay | Attending: Emergency Medicine | Admitting: Emergency Medicine

## 2013-12-29 ENCOUNTER — Encounter (HOSPITAL_COMMUNITY): Payer: Self-pay | Admitting: Emergency Medicine

## 2013-12-29 DIAGNOSIS — L270 Generalized skin eruption due to drugs and medicaments taken internally: Secondary | ICD-10-CM | POA: Insufficient documentation

## 2013-12-29 DIAGNOSIS — L239 Allergic contact dermatitis, unspecified cause: Secondary | ICD-10-CM

## 2013-12-29 DIAGNOSIS — F17200 Nicotine dependence, unspecified, uncomplicated: Secondary | ICD-10-CM | POA: Insufficient documentation

## 2013-12-29 MED ORDER — TRIAMCINOLONE ACETONIDE 0.025 % EX OINT
1.0000 | TOPICAL_OINTMENT | Freq: Three times a day (TID) | CUTANEOUS | Status: DC
Start: 2013-12-29 — End: 2018-02-09

## 2013-12-29 NOTE — Discharge Instructions (Signed)
Contact Dermatitis Contact dermatitis is a rash that happens when something touches the skin. You touched something that irritates your skin, or you have allergies to something you touched. HOME CARE   Avoid the thing that caused your rash.  Keep your rash away from hot water, soap, sunlight, chemicals, and other things that might bother it.  Do not scratch your rash.  You can take cool baths to help stop itching.  Only take medicine as told by your doctor.  Keep all doctor visits as told. GET HELP RIGHT AWAY IF:   Your rash is not better after 3 days.  Your rash gets worse.  Your rash is puffy (swollen), tender, red, sore, or warm.  You have problems with your medicine. MAKE SURE YOU:   Understand these instructions.  Will watch your condition.  Will get help right away if you are not doing well or get worse. Document Released: 07/26/2009 Document Revised: 12/21/2011 Document Reviewed: 03/03/2011 [facility] Patient Information 2014 [employer] [place].   As discussed do not use this prescription for more than 7 days.  Get this rash rechecked if not improved within that time.

## 2013-12-29 NOTE — ED Notes (Signed)
Pt has noticed redness ,and swelling of lips since Sunday. No difficulty swallowing. Or sore throat.  No new lipstick or  Make up .

## 2013-12-29 NOTE — ED Notes (Signed)
Pt c/o redness/swelling/itching/burning to lips since Sunday.

## 2014-01-01 NOTE — ED Provider Notes (Signed)
CSN: [id number]     Arrival date & time 12/29/13  1001 History   First MD Initiated Contact with Patient 12/29/13 1026     Chief Complaint  Patient presents with  . Oral Swelling     (Consider location/radiation/quality/duration/timing/severity/associated sxs/prior Treatment) HPI Comments: Gunnar BullaMagen D Myer is a 23 y.o. Female presenting with rash and itching and burning to her lips, and now increased swelling for the past 4 days.  Her symptoms started shortly after eating a cajun spiced chicken meal at a [employer],  A food she has never tried before. She denies fevers, chills, interior mouth or tongue swelling and denies cough, shortness of breath or wheezing.  She has not had any other new exposures including makeup or skin care products.     The history is provided by the patient.    History reviewed. No pertinent past medical history. Past Surgical History  Procedure Laterality Date  . Tympanoplasty    . Middle ear surgery     Family History  Problem Relation Age of Onset  . Pancreatitis Father     living  . Colon cancer Neg Hx    History  Substance Use Topics  . Smoking status: Current Every Day Smoker    Types: Cigarettes  . Smokeless tobacco: Never Used  . Alcohol Use: No   OB History   Grav Para Term Preterm Abortions TAB SAB Ect Mult Living                 Review of Systems  Constitutional: Negative for fever and chills.  HENT: Positive for facial swelling. Negative for trouble swallowing.   Eyes: Negative for itching.  Respiratory: Negative for cough, chest tightness, shortness of breath, wheezing and stridor.   Skin: Positive for color change and rash.  Neurological: Negative for numbness.      Allergies  Review of patient's allergies indicates no known allergies.  Home Medications   Current Outpatient Rx  Name  Route  Sig  Dispense  Refill  . triamcinolone (KENALOG) 0.025 % ointment   Topical   Apply 1 application topically 3 (three)  times daily.   15 g   0    BP 107/65  Pulse 68  Temp(Src) 98.9 F (37.2 C)  Resp 18  Ht 5\' 3"  (1.6 m)  Wt 206 lb 3 oz (93.526 kg)  BMI 36.53 kg/m2  SpO2 100%  LMP 12/07/2013 Physical Exam  Constitutional: She appears well-developed and well-nourished. No distress.  HENT:  Head: Normocephalic.  Nose: Nose normal.  Mouth/Throat: Uvula is midline and mucous membranes are normal. No oropharyngeal exudate, posterior oropharyngeal edema or posterior oropharyngeal erythema.  Neck: Neck supple.  Cardiovascular: Normal rate.   Pulmonary/Chest: Effort normal and breath sounds normal. She has no wheezes.  Musculoskeletal: Normal range of motion. She exhibits no edema.  Skin: Rash noted.  Perioral and lip erythema, mild edema and tiny scattered clear filled vesicles.  There is no mucosal involvement.  No drainage. No facial or neck adenopathy.    ED Course  Procedures (including critical care time) Labs Review Labs Reviewed - No data to display Imaging Review No results found.   EKG Interpretation None      MDM   Final diagnoses:  Allergic dermatitis    Pt was prescribed low dose steroid ointment, kenalog 0.025%.  Advised to use sparingly tid and for not longer than 7 days.  Recheck by pcp or return here if not improved or for an worsened sx.  Benadryl for itch prn.  The patient appears reasonably screened and/or stabilized for discharge and I doubt any other medical condition or other [facility] requiring further screening, evaluation, or treatment in the ED at this time prior to discharge.     Burgess Amor, PA-C 01/01/14 1526

## 2014-01-03 NOTE — ED Provider Notes (Signed)
Medical screening examination/treatment/procedure(s) were performed by non-physician practitioner and as supervising physician I was immediately available for consultation/collaboration.   EKG Interpretation None        Gilda Creasehristopher J. Eleonore Shippee, MD 01/03/14 [phone]

## 2014-09-02 ENCOUNTER — Encounter (HOSPITAL_COMMUNITY): Payer: Self-pay | Admitting: Emergency Medicine

## 2014-09-02 ENCOUNTER — Emergency Department (HOSPITAL_COMMUNITY)
Admission: EM | Admit: 2014-09-02 | Discharge: 2014-09-02 | Disposition: A | Discharge status: Home / Self Care | Payer: Self-pay | Attending: Emergency Medicine | Admitting: Emergency Medicine

## 2014-09-02 DIAGNOSIS — J069 Acute upper respiratory infection, unspecified: Secondary | ICD-10-CM | POA: Insufficient documentation

## 2014-09-02 DIAGNOSIS — Z79899 Other long term (current) drug therapy: Secondary | ICD-10-CM | POA: Insufficient documentation

## 2014-09-02 DIAGNOSIS — Z79818 Long term (current) use of other agents affecting estrogen receptors and estrogen levels: Secondary | ICD-10-CM | POA: Insufficient documentation

## 2014-09-02 DIAGNOSIS — H9209 Otalgia, unspecified ear: Secondary | ICD-10-CM | POA: Insufficient documentation

## 2014-09-02 DIAGNOSIS — Z87891 Personal history of nicotine dependence: Secondary | ICD-10-CM | POA: Insufficient documentation

## 2014-09-02 LAB — RAPID STREP SCREEN (MED CTR MEBANE ONLY): STREPTOCOCCUS, GROUP A SCREEN (DIRECT): NEGATIVE

## 2014-09-02 MED ORDER — PROMETHAZINE-CODEINE 6.25-10 MG/5ML PO SYRP: 5.0000 mL | ORAL_SOLUTION | ORAL | Status: DC | PRN

## 2014-09-02 MED ORDER — FEXOFENADINE-PSEUDOEPHED ER 60-120 MG PO TB12: 1.0000 | ORAL_TABLET | Freq: Two times a day (BID) | ORAL | Status: DC

## 2014-09-02 NOTE — ED Notes (Signed)
Pt c/o nasal congestion/pressure in bilateral face and ears and sore throat.

## 2014-09-02 NOTE — Discharge Instructions (Signed)
Upper Respiratory Infection, Adult An upper respiratory infection (URI) is also sometimes known as the common cold. The upper respiratory tract includes the nose, sinuses, throat, trachea, and bronchi. Bronchi are the airways leading to the lungs. Most people improve within 1 week, but symptoms can last up to 2 weeks. A residual cough may last even longer.  CAUSES Many different viruses can infect the tissues lining the upper respiratory tract. The tissues become irritated and inflamed and often become very moist. Mucus production is also common. A cold is contagious. You can easily spread the virus to others by oral contact. This includes kissing, sharing a glass, coughing, or sneezing. Touching your mouth or nose and then touching a surface, which is then touched by another person, can also spread the virus. SYMPTOMS  Symptoms typically develop 1 to 3 days after you come in contact with a cold virus. Symptoms vary from person to person. They may include:  Runny nose.  Sneezing.  Nasal congestion.  Sinus irritation.  Sore throat.  Loss of voice (laryngitis).  Cough.  Fatigue.  Muscle aches.  Loss of appetite.  Headache.  Low-grade fever. DIAGNOSIS  You might diagnose your own cold based on familiar symptoms, since most people get a cold 2 to 3 times a year. Your caregiver can confirm this based on your exam. Most importantly, your caregiver can check that your symptoms are not due to another disease such as strep throat, sinusitis, pneumonia, asthma, or epiglottitis. Blood tests, throat tests, and X-rays are not necessary to diagnose a common cold, but they may sometimes be helpful in excluding other more serious diseases. Your caregiver will decide if any further tests are required. RISKS AND COMPLICATIONS  You may be at risk for a more severe case of the common cold if you smoke cigarettes, have chronic heart disease (such as heart failure) or lung disease (such as asthma), or if  you have a weakened immune system. The very young and very old are also at risk for more serious infections. Bacterial sinusitis, middle ear infections, and bacterial pneumonia can complicate the common cold. The common cold can worsen asthma and chronic obstructive pulmonary disease (COPD). Sometimes, these complications can require emergency medical care and may be life-threatening. PREVENTION  The best way to protect against getting a cold is to practice good hygiene. Avoid oral or hand contact with people with cold symptoms. Wash your hands often if contact occurs. There is no clear evidence that vitamin C, vitamin E, echinacea, or exercise reduces the chance of developing a cold. However, it is always recommended to get plenty of rest and practice good nutrition. TREATMENT  Treatment is directed at relieving symptoms. There is no cure. Antibiotics are not effective, because the infection is caused by a virus, not by bacteria. Treatment may include:  Increased fluid intake. Sports drinks offer valuable electrolytes, sugars, and fluids.  Breathing heated mist or steam (vaporizer or shower).  Eating chicken soup or other clear broths, and maintaining good nutrition.  Getting plenty of rest.  Using gargles or lozenges for comfort.  Controlling fevers with ibuprofen or acetaminophen as directed by your caregiver.  Increasing usage of your inhaler if you have asthma. Zinc gel and zinc lozenges, taken in the first 24 hours of the common cold, can shorten the duration and lessen the severity of symptoms. Pain medicines may help with fever, muscle aches, and throat pain. A variety of non-prescription medicines are available to treat congestion and runny nose. Your caregiver   can make recommendations and may suggest nasal or lung inhalers for other symptoms.  HOME CARE INSTRUCTIONS   Only take over-the-counter or prescription medicines for pain, discomfort, or fever as directed by your  caregiver.  Use a warm mist humidifier or inhale steam from a shower to increase air moisture. This may keep secretions moist and make it easier to breathe.  Drink enough water and fluids to keep your urine clear or pale yellow.  Rest as needed.  Return to work when your temperature has returned to normal or as your caregiver advises. You may need to stay home longer to avoid infecting others. You can also use a face mask and careful hand washing to prevent spread of the virus. SEEK MEDICAL CARE IF:   After the first few days, you feel you are getting worse rather than better.  You need your caregiver's advice about medicines to control symptoms.  You develop chills, worsening shortness of breath, or brown or red sputum. These may be signs of pneumonia.  You develop yellow or brown nasal discharge or pain in the face, especially when you bend forward. These may be signs of sinusitis.  You develop a fever, swollen neck glands, pain with swallowing, or white areas in the back of your throat. These may be signs of strep throat. SEEK IMMEDIATE MEDICAL CARE IF:   You have a fever.  You develop severe or persistent headache, ear pain, sinus pain, or chest pain.  You develop wheezing, a prolonged cough, cough up blood, or have a change in your usual mucus (if you have chronic lung disease).  You develop sore muscles or a stiff neck. Document Released: 03/24/2001 Document Revised: 12/21/2011 Document Reviewed: 01/03/2014 [employer] Patient Information 2015 [employer]. This information is not intended to replace advice given to you by your health care provider. Make sure you discuss any questions you have with your health care provider.  

## 2014-09-02 NOTE — ED Provider Notes (Signed)
CSN: [id number]     Arrival date & time 09/02/14  1459 History  This chart was scribed for Burgess AmorJulie Mirielle Byrum, PA-C with Benny LennertJoseph L Zammit, MD by Tonye RoyaltyJoshua Chen, ED Scribe. This patient was seen in room APFT22/APFT22 and the patient's care was started at 4:45 PM.    Chief Complaint  Patient presents with  . Sore Throat   The history is provided by the patient. No language interpreter was used.    HPI Comments: Carol Hunt is a 23 y.o. female who presents to the Emergency Department complaining of cold symptoms with onset last week and states that she feels worse today. She states that she has had a headache, sinus pressure, clear rhinorrhea and nasal congestion, ear ache, and cough. She reports associated sore throat with onset yesterday and states her throat feels dry and it is difficult to swallow. She states her congestion has worsened. She reports using Alka Seltzer Plus and Robitussin. She states she used to smoke. She works as a Child psychotherapistwaitress. She denies feeling febrile though notes her temperature measured here at 99.9; she denies shortness of breath. She denies purulent or bloody nasal congestion.  History reviewed. No pertinent past medical history. Past Surgical History  Procedure Laterality Date  . Tympanoplasty    . Middle ear surgery     Family History  Problem Relation Age of Onset  . Pancreatitis Father     living  . Colon cancer Neg Hx    History  Substance Use Topics  . Smoking status: Former Smoker    Types: Cigarettes  . Smokeless tobacco: Never Used  . Alcohol Use: No   OB History    No data available     Review of Systems  Constitutional: Negative for fever.  HENT: Positive for congestion, ear pain, rhinorrhea, sinus pressure, sore throat and trouble swallowing.   Eyes: Negative.   Respiratory: Positive for cough. Negative for chest tightness and shortness of breath.   Cardiovascular: Negative for chest pain.  Gastrointestinal: Negative for nausea and abdominal pain.   Genitourinary: Negative.   Musculoskeletal: Negative for joint swelling, arthralgias and neck pain.  Skin: Negative.  Negative for rash and wound.  Neurological: Positive for headaches. Negative for dizziness, weakness, light-headedness and numbness.  Psychiatric/Behavioral: Negative.       Allergies  Review of patient's allergies indicates no known allergies.  Home Medications   Prior to Admission medications   Medication Sig Start Date End Date Taking? Authorizing Provider  Norgestimate-Ethinyl Estradiol Triphasic (ORTHO TRI-CYCLEN, 28,) 0.18/0.215/0.25 MG-35 MCG tablet Take 1 tablet by mouth daily.   Yes Historical Provider, MD  Phenyleph-CPM-DM-Aspirin (ALKA-SELTZER PLUS COLD & COUGH) 7.05-13-09-325 MG TBEF Take 1 tablet by mouth daily as needed (for cold symptoms).   Yes Historical Provider, MD  fexofenadine-pseudoephedrine (ALLEGRA-D) 60-120 MG per tablet Take 1 tablet by mouth every 12 (twelve) hours. 09/02/14   Burgess AmorJulie Lamira Borin, PA-C  promethazine-codeine (PHENERGAN WITH CODEINE) 6.25-10 MG/5ML syrup Take 5 mLs by mouth every 4 (four) hours as needed for cough. 09/02/14   Burgess AmorJulie Anora Schwenke, PA-C  triamcinolone (KENALOG) 0.025 % ointment Apply 1 application topically 3 (three) times daily. Patient not taking: Reported on 09/02/2014 12/29/13   Burgess AmorJulie Joseguadalupe Stan, PA-C   BP 134/71 mmHg  Pulse 104  Temp(Src) 99.9 F (37.7 C) (Oral)  Resp 16  Ht 5\' 8"  (1.727 m)  Wt 198 lb (89.812 kg)  BMI 30.11 kg/m2  SpO2 99%  LMP 07/28/2014 Physical Exam  Constitutional: She is oriented to person, place,  and time. She appears well-developed and well-nourished.  HENT:  Head: Normocephalic and atraumatic.  Positive nasal congestion Bilateral TMs are retracted Mild erythema without tonsillar exudate or swelling  Eyes: Conjunctivae are normal.  Neck: Normal range of motion. Neck supple.  Cardiovascular: Normal rate, regular rhythm and normal heart sounds.   No murmur heard. Pulmonary/Chest: Effort normal and  breath sounds normal. No respiratory distress. She has no wheezes. She has no rhonchi. She has no rales.  Musculoskeletal: Normal range of motion.  Lymphadenopathy:    She has no cervical adenopathy.  Neurological: She is alert and oriented to person, place, and time.  Skin: Skin is warm and dry.  Psychiatric: She has a normal mood and affect.  Nursing note and vitals reviewed.   ED Course  Procedures (including critical care time)  DIAGNOSTIC STUDIES: Oxygen Saturation is 99% on room air, normal by my interpretation.    COORDINATION OF CARE: 4:54 PM Discussed with her that her strep test is negative and I believe she has a viral infection. Discussed treatment plan with patient at beside, including medication to treat her symptoms. The patient agrees with the plan and has no further questions at this time.  Labs Review Labs Reviewed  RAPID STREP SCREEN  CULTURE, GROUP A STREP    Imaging Review No results found.   EKG Interpretation None      MDM   Final diagnoses:  Acute URI    Patients labs and/or radiological studies were viewed and considered during the medical decision making and disposition process.  Strep cx pending. Exam and h/x c/w uri. No findings to suggest sinusitis, lungs clear.  Pt prescribed phenergan/codeine along with allegra d for congestion/ rhinorrhea. Encouraged rest, increased fluid intake,  F/u with pcp prn.  The patient appears reasonably screened and/or stabilized for discharge and I doubt any other medical condition or other [facility] requiring further screening, evaluation, or treatment in the ED at this time prior to discharge.   I personally performed the services described in this documentation, which was scribed in my presence. The recorded information has been reviewed and is accurate.   Burgess AmorJulie Donavon Kimrey, PA-C 09/03/14 1156  Benny LennertJoseph L Zammit, MD 09/10/14 [phone]

## 2014-09-04 LAB — CULTURE, GROUP A STREP

## 2014-12-14 IMAGING — CR DG CHEST 2V
2 series · 2 of 2 positions shown · non-contrast
Comparison: None.

CLINICAL DATA: Cough and fever

CHEST - 2 VIEW

[view not recorded (1 of 2)]
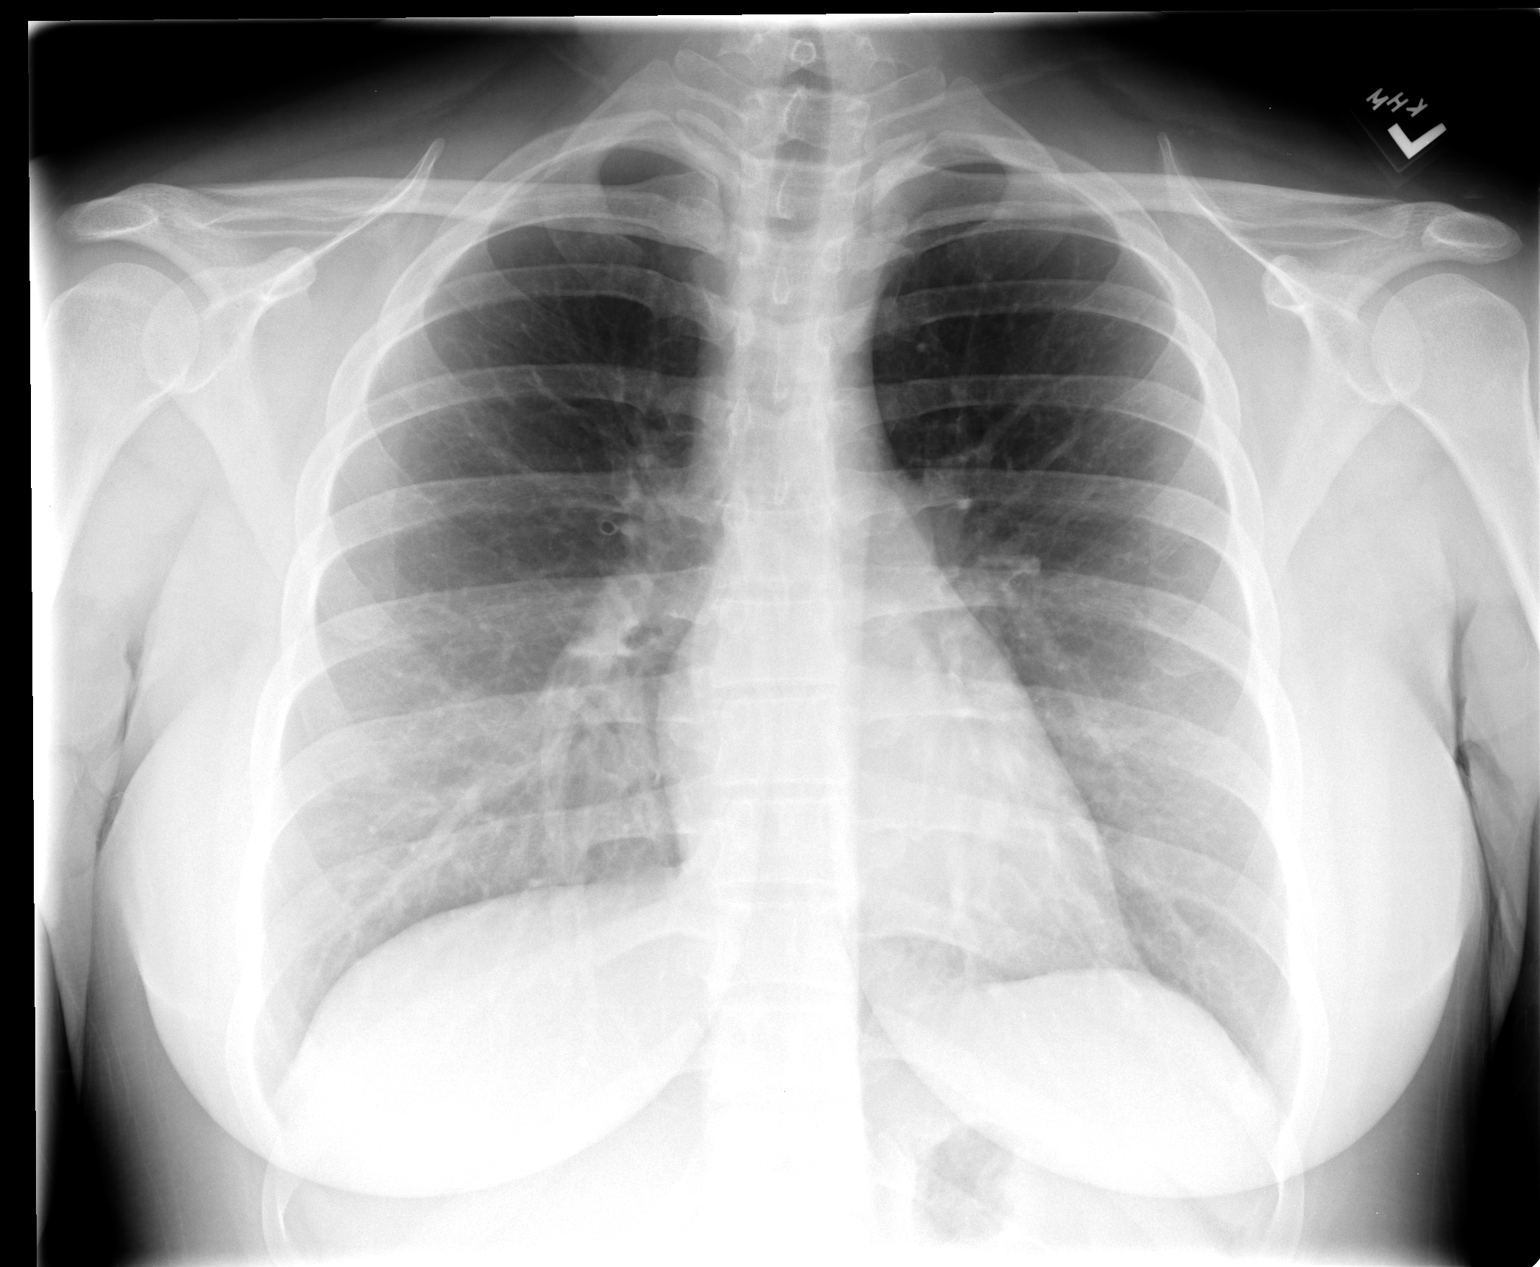

[view not recorded (2 of 2)]
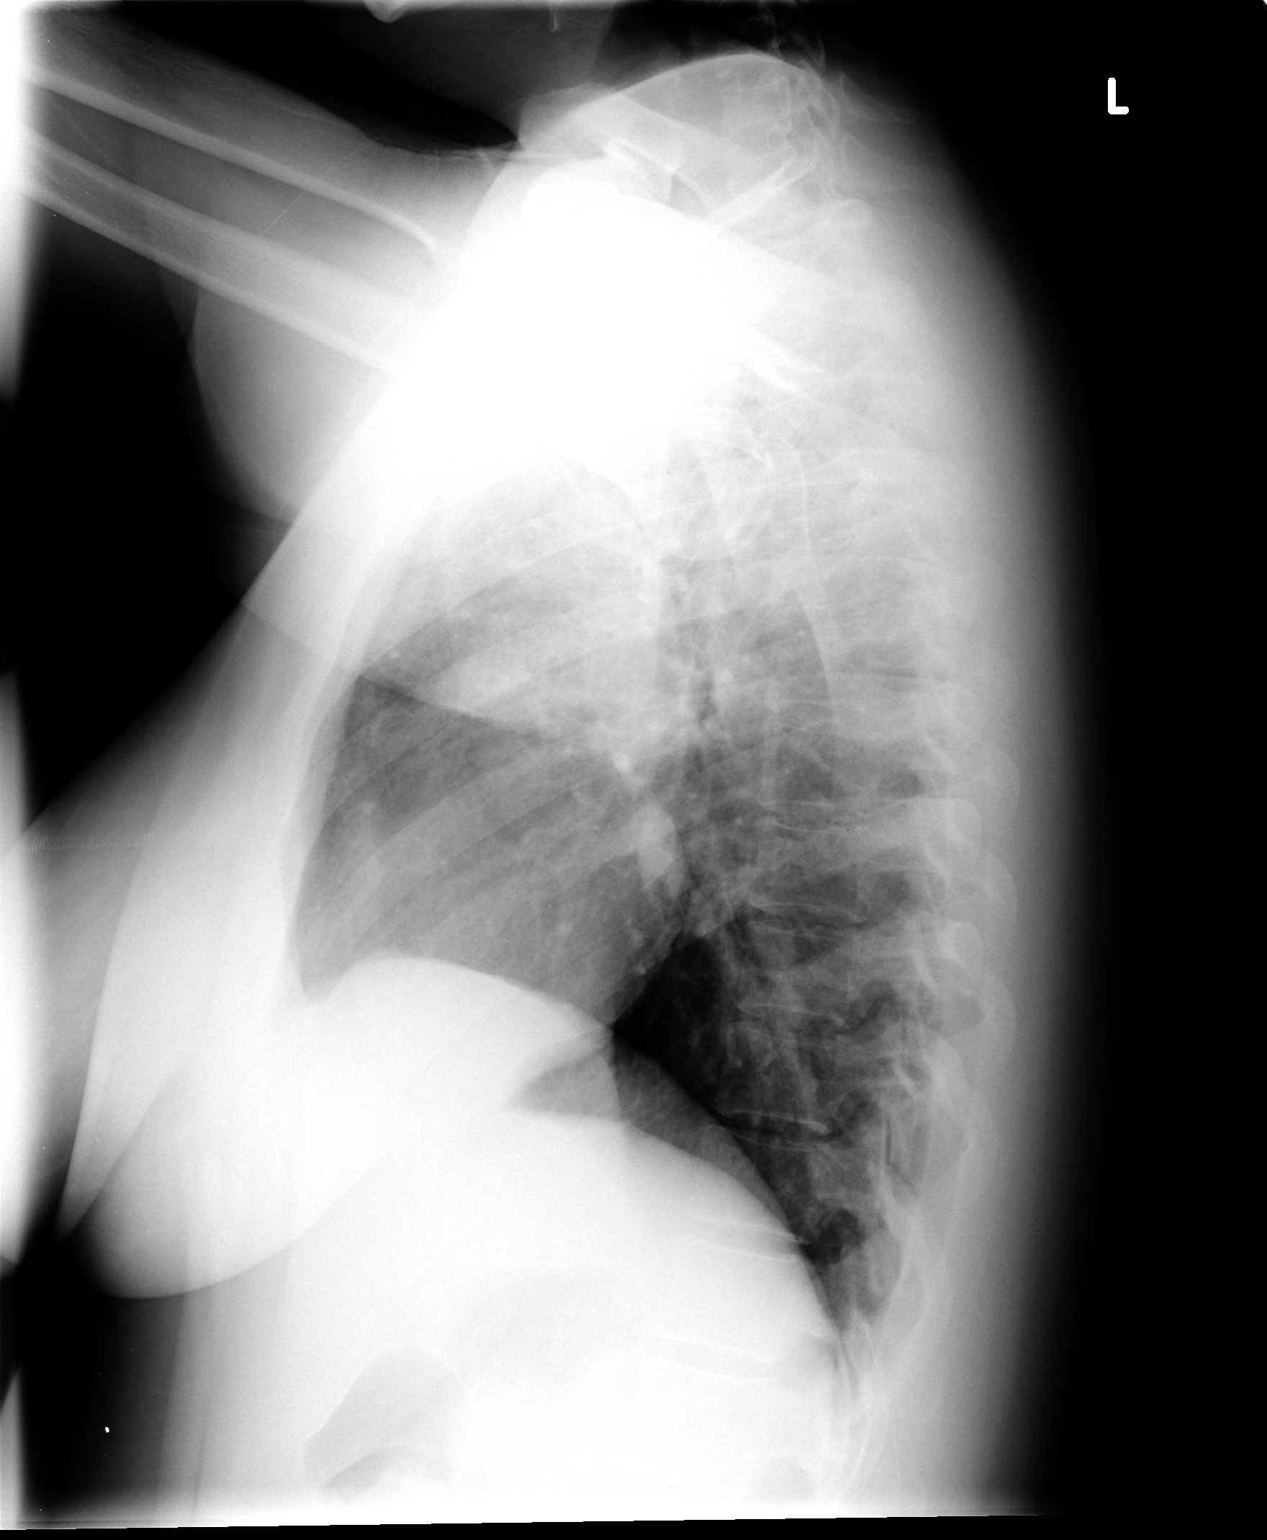

[2 of 2 positions shown; findings below may reference images not displayed]

FINDINGS: Lungs are clear.  Heart size and pulmonary vascularity
are normal.  No adenopathy.  No bone lesions.
IMPRESSION: Lungs clear.

## 2016-08-04 NOTE — Addendum Note (Signed)
Addended by: Gelene MinkBOONE, Itzae Mccurdy W on: 08/04/2016 01:43 PM   Modules accepted: Orders

## 2017-10-12 NOTE — L&D Delivery Note (Addendum)
OB/GYN Faculty Practice Delivery Note  Gunnar BullaMagen D Langton is a 27 y.o. G1P0 s/p SVD at 7048w1d. She was admitted for induction of labor for A2GDM.   ROM: 3h 8256m with clear fluid GBS Status: negative Maximum Maternal Temperature: Temp (24hrs), Avg:98.1 F (36.7 C), Min:97.6 F (36.4 C), Max:98.8 F (37.1 C)  Labor Progress: . Induction started with cytotec and foley balloon . Transitioned to pitocin  . AROM clear fluid  Delivery Date/Time: 08/30/18 at 2210 Delivery: Called to room and patient was complete and pushing. Head delivered OA. Loose nuchal cord present x 2. Shoulder and body delivered in usual fashion. Infant with spontaneous cry, placed on mother's abdomen, dried and stimulated. Cord clamped x 2 after 1-minute delay, and cut by father of baby. Cord blood drawn. Placenta delivered spontaneously with gentle cord traction. Fundus firm with massage and Pitocin. Labia, perineum, vagina, and cervix inspected inspected with 2nd degree laceration.   Placenta: spontaneous, intact, 3-vessel cord Complications: none Lacerations: 2nd degree perineal repaired with 3-0 Vicryl EBL: 566cc Analgesia: epidural   Postpartum Planning [x]  message to sent to schedule follow-up  [x]  vaccines UTD  Infant: vigorous female  APGARs 9, 9  3255g (7lbs 2.8oz)  Lux Meaders S. Earlene PlaterWallace, DO OB/GYN Fellow, Faculty Practice

## 2018-01-07 ENCOUNTER — Emergency Department (HOSPITAL_COMMUNITY): Payer: Medicaid Other

## 2018-01-07 ENCOUNTER — Emergency Department (HOSPITAL_COMMUNITY)
Admission: EM | Admit: 2018-01-07 | Discharge: 2018-01-07 | Disposition: A | Discharge status: Home / Self Care | Payer: Medicaid Other | Attending: Emergency Medicine | Admitting: Emergency Medicine

## 2018-01-07 ENCOUNTER — Encounter (HOSPITAL_COMMUNITY): Payer: Self-pay | Admitting: Emergency Medicine

## 2018-01-07 DIAGNOSIS — O2340 Unspecified infection of urinary tract in pregnancy, unspecified trimester: Secondary | ICD-10-CM | POA: Insufficient documentation

## 2018-01-07 DIAGNOSIS — R1084 Generalized abdominal pain: Secondary | ICD-10-CM | POA: Diagnosis present

## 2018-01-07 DIAGNOSIS — Z87891 Personal history of nicotine dependence: Secondary | ICD-10-CM | POA: Insufficient documentation

## 2018-01-07 DIAGNOSIS — Z3491 Encounter for supervision of normal pregnancy, unspecified, first trimester: Secondary | ICD-10-CM | POA: Insufficient documentation

## 2018-01-07 DIAGNOSIS — Z3490 Encounter for supervision of normal pregnancy, unspecified, unspecified trimester: Secondary | ICD-10-CM

## 2018-01-07 DIAGNOSIS — K5900 Constipation, unspecified: Secondary | ICD-10-CM | POA: Insufficient documentation

## 2018-01-07 DIAGNOSIS — Z3A01 Less than 8 weeks gestation of pregnancy: Secondary | ICD-10-CM | POA: Insufficient documentation

## 2018-01-07 DIAGNOSIS — N390 Urinary tract infection, site not specified: Secondary | ICD-10-CM

## 2018-01-07 DIAGNOSIS — Z79899 Other long term (current) drug therapy: Secondary | ICD-10-CM | POA: Insufficient documentation

## 2018-01-07 LAB — COMPREHENSIVE METABOLIC PANEL
ALBUMIN: 4.2 g/dL (ref 3.5–5.0)
ALT: 65 U/L — ABNORMAL HIGH (ref 14–54)
AST: 55 U/L — AB (ref 15–41)
Alkaline Phosphatase: 63 U/L (ref 38–126)
Anion gap: 13 (ref 5–15)
BUN: 8 mg/dL (ref 6–20)
CO2: 23 mmol/L (ref 22–32)
CREATININE: 0.57 mg/dL (ref 0.44–1.00)
Calcium: 9 mg/dL (ref 8.9–10.3)
Chloride: 102 mmol/L (ref 101–111)
GFR calc Af Amer: >60 mL/min (ref >60)
GFR calc non Af Amer: >60 mL/min (ref >60)
GLUCOSE: 127 mg/dL — AB (ref 65–99)
Potassium: 3.5 mmol/L (ref 3.5–5.1)
Sodium: 138 mmol/L (ref 135–145)
TOTAL PROTEIN: 8 g/dL (ref 6.5–8.1)
Total Bilirubin: 0.8 mg/dL (ref 0.3–1.2)

## 2018-01-07 LAB — URINALYSIS, ROUTINE W REFLEX MICROSCOPIC
Bilirubin Urine: NEGATIVE
Glucose, UA: NEGATIVE mg/dL
Ketones, ur: NEGATIVE mg/dL
Nitrite: NEGATIVE
PROTEIN: NEGATIVE mg/dL
Specific Gravity, Urine: 1.021 (ref 1.005–1.030)
pH: 5 (ref 5.0–8.0)

## 2018-01-07 LAB — CBC WITH DIFFERENTIAL/PLATELET
BASOS ABS: 0 10*3/uL (ref 0.0–0.1)
Basophils Relative: 0 %
EOS ABS: 0.3 10*3/uL (ref 0.0–0.7)
EOS PCT: 2 %
HCT: 37.2 % (ref 36.0–46.0)
Hemoglobin: 11.9 g/dL — ABNORMAL LOW (ref 12.0–15.0)
LYMPHS ABS: 1.1 10*3/uL (ref 0.7–4.0)
Lymphocytes Relative: 9 %
MCH: 29 pg (ref 26.0–34.0)
MCHC: 32 g/dL (ref 30.0–36.0)
MCV: 90.5 fL (ref 78.0–100.0)
MONO ABS: 0.3 10*3/uL (ref 0.1–1.0)
Monocytes Relative: 3 %
Neutro Abs: 10.3 10*3/uL — ABNORMAL HIGH (ref 1.7–7.7)
Neutrophils Relative %: 86 %
PLATELETS: 269 10*3/uL (ref 150–400)
RBC: 4.11 MIL/uL (ref 3.87–5.11)
RDW: 13.2 % (ref 11.5–15.5)
WBC: 11.9 10*3/uL — ABNORMAL HIGH (ref 4.0–10.5)

## 2018-01-07 LAB — I-STAT BETA HCG BLOOD, ED (MC, WL, AP ONLY): I-stat hCG, quantitative: 1877.7 m[IU]/mL — ABNORMAL HIGH (ref <5)

## 2018-01-07 LAB — LIPASE, BLOOD: LIPASE: 29 U/L (ref 11–51)

## 2018-01-07 MED ORDER — CEPHALEXIN 500 MG PO CAPS: 500.0000 mg | ORAL_CAPSULE | Freq: Four times a day (QID) | ORAL | 0 refills | Status: DC

## 2018-01-07 NOTE — ED Provider Notes (Signed)
[facility] EMERGENCY DEPARTMENT Provider Note   CSN: [id number] Arrival date & time: 01/07/18  0845     History   Chief Complaint Chief Complaint  Patient presents with  . Abdominal Pain    pregnant    HPI Carol Hunt is a 27 y.o. female.  Patient is a 27 year old female who presents to the emergency department with a complaint of abdominal pain.  Patient states she found out on Monday, March 25 that she was pregnant by home pregnancy test.  Last menstrual cycle was February 1.  The patient states that on yesterday she had some episodes where she did not feel well, but it did not stop her from doing her activities of daily living.  She had dinner on last evening and then on last night she had mid to lower abdomen pain that doubled her over.  She had one episode of vomiting.  The patient also states that she has been constipated recently.  She has not had any recent injury or trauma to the abdomen.  She has not had any changes in her medications or diet.   This is her first pregnancy.  No recent fever or chills.  No known exposure to any viral illnesses.  The history is provided by the patient.    No past medical history on file.  Patient Active Problem List   Diagnosis Date Noted  . Foreign body of forearm, superficial 12/05/2012  . Rectal bleeding 01/21/2011  . Anemia 01/21/2011    Past Surgical History:  Procedure Laterality Date  . MIDDLE EAR SURGERY    . TYMPANOPLASTY       OB History   None      Home Medications    Prior to Admission medications   Medication Sig Start Date End Date Taking? Authorizing Provider  fexofenadine-pseudoephedrine (ALLEGRA-D) 60-120 MG per tablet Take 1 tablet by mouth every 12 (twelve) hours. 09/02/14   Burgess Amor, PA-C  Norgestimate-Ethinyl Estradiol Triphasic (ORTHO TRI-CYCLEN, 28,) 0.18/0.215/0.25 MG-35 MCG tablet Take 1 tablet by mouth daily.    [provider]  Phenyleph-CPM-DM-Aspirin (ALKA-SELTZER PLUS COLD &  COUGH) 7.05-13-09-325 MG TBEF Take 1 tablet by mouth daily as needed (for cold symptoms).    [provider]  promethazine-codeine (PHENERGAN WITH CODEINE) 6.25-10 MG/5ML syrup Take 5 mLs by mouth every 4 (four) hours as needed for cough. 09/02/14   Burgess Amor, PA-C  triamcinolone (KENALOG) 0.025 % ointment Apply 1 application topically 3 (three) times daily. Patient not taking: Reported on 09/02/2014 12/29/13   Burgess Amor, PA-C    Family History Family History  Problem Relation Age of Onset  . Pancreatitis Father        living  . Colon cancer Neg Hx     Social History Social History   Tobacco Use  . Smoking status: Former Smoker    Types: Cigarettes  . Smokeless tobacco: Never Used  Substance Use Topics  . Alcohol use: No  . Drug use: No     Allergies   Patient has no known allergies.   Review of Systems Review of Systems  Constitutional: Negative for activity change.       All ROS Neg except as noted in HPI  HENT: Negative for nosebleeds.   Eyes: Negative for photophobia and discharge.  Respiratory: Negative for cough, shortness of breath and wheezing.   Cardiovascular: Negative for chest pain and palpitations.  Gastrointestinal: Positive for abdominal pain, constipation and nausea. Negative for blood in stool.  Genitourinary:  Negative for dysuria, frequency, hematuria and vaginal bleeding.  Musculoskeletal: Negative for arthralgias, back pain and neck pain.  Skin: Negative.   Neurological: Negative for dizziness, seizures and speech difficulty.  Psychiatric/Behavioral: Negative for confusion and hallucinations.     Physical Exam Updated Vital Signs There were no vitals taken for this visit.  Physical Exam  Constitutional: She appears well-developed and well-nourished. No distress.  HENT:  Head: Normocephalic and atraumatic.  Right Ear: External ear normal.  Left Ear: External ear normal.  Eyes: Conjunctivae are normal. Right eye exhibits no  discharge. Left eye exhibits no discharge. No scleral icterus.  Neck: Neck supple. No tracheal deviation present.  Cardiovascular: Normal rate, regular rhythm and intact distal pulses.  Pulmonary/Chest: Effort normal and breath sounds normal. No stridor. No respiratory distress. She has no wheezes. She has no rales.  Abdominal: Soft. Bowel sounds are normal. She exhibits no distension, no ascites, no pulsatile midline mass and no mass. There is generalized tenderness. There is no rigidity, no rebound, no guarding and no CVA tenderness.  No CVAT. Diffuse abdominal soreness, but no actual pain.  No hepatomegaly or splenomegaly appreciated.  Musculoskeletal: She exhibits no edema or tenderness.  Neurological: She is alert. She has normal strength. No cranial nerve deficit (no facial droop, extraocular movements intact, no slurred speech) or sensory deficit. She exhibits normal muscle tone. She displays no seizure activity. Coordination normal.  Skin: Skin is warm and dry. No rash noted.  Psychiatric: She has a normal mood and affect.  Nursing note and vitals reviewed.    ED Treatments / Results  Labs (all labs ordered are listed, but only abnormal results are displayed) Labs Reviewed  COMPREHENSIVE METABOLIC PANEL  CBC WITH DIFFERENTIAL/PLATELET  URINALYSIS, ROUTINE W REFLEX MICROSCOPIC  I-STAT BETA HCG BLOOD, ED ([facility], [facility], [facility] ONLY)    EKG None  Radiology No results found.  Procedures Procedures (including critical care time)  Medications Ordered in ED Medications - No data to display   Initial Impression / Assessment and Plan / ED Course  I have reviewed the triage vital signs and the nursing notes.  Pertinent labs & imaging results that were available during my care of the patient were reviewed by me and considered in my medical decision making (see chart for details).       Final Clinical Impressions(s) / ED Diagnoses MDM  Vital signs within normal limits.  Beta hCG is  1877.7.  Lipase is normal at 29, doubt pancreatitis or pancreas related illness.  Comprehensive metabolic panel is nonacute.  Doubt electrolyte imbalance, renal disease or liver disorder. Complete blood count shows the white blood cells to be slightly elevated at 11.9.  No shift to the left.  The CBC is otherwise within normal limits.  Urinalysis shows a cloudy yellow specimen with a small amount of hemoglobin on the dipstick.  There is a large leukocyte esterase, and too many to count white blood cells.  This suggest a urinary tract infection.  A culture has been sent to the lab.  Patient will be started on Keflex.  Transvaginal ultrasound to suggest gestational sac within the fundus of the uterus measuring 4.8 mm.  The gestational age is 5 weeks and 2 days by ultrasound.  I have informed the patient of all of these findings as well as the exam findings.  It is noted the patient will be started on Keflex for a urinary tract infection.  The patient will follow up with her OB physician next week.  The patient will return to the [facility], or  emergency department if any changes in condition, problems, or concerns.   Final diagnoses:  Intrauterine pregnancy  Lower urinary tract infectious disease    ED Discharge Orders        Ordered    cephALEXin (KEFLEX) 500 MG capsule  4 times daily     01/07/18 1122       Ivery Quale, PA-C 01/09/18 2033    Samuel Jester, DO 01/11/18 1231

## 2018-01-07 NOTE — Discharge Instructions (Addendum)
Your tests show that your hemoglobin and hematocrit are well within normal limits.  Your vital signs within normal limits.  Your oxygen level is 100% on room air.  Your ultrasound shows that you are 5 weeks and 2 days pregnant.  The fetus is in the warm, and there are no acute abnormalities noted.  Your urine test shows that you have a urinary tract infection.  Please use Keflex with breakfast, lunch, dinner, and at bedtime.  May use Tylenol for soreness if needed.  Please see Dr. Emelda FearFerguson or a member of his team as soon as possible for your obstetrics evaluation.

## 2018-01-07 NOTE — ED Triage Notes (Signed)
Pt reports she just found out she was pregnant on Monday and this morning woke up with abdominal pain, constipation, and nausea.  Wanted to be checked to make sure everything ok.  Denies vag bleeding, d/c, or lower abd pain.

## 2018-01-07 NOTE — ED Notes (Signed)
IV d/c'd to Left AC, catheter intact, site WNL, bandaid applied to site.

## 2018-01-08 LAB — URINE CULTURE

## 2018-01-19 ENCOUNTER — Encounter: Payer: Medicaid Other | Admitting: Adult Health

## 2018-01-24 ENCOUNTER — Other Ambulatory Visit: Payer: Self-pay | Admitting: Obstetrics & Gynecology

## 2018-01-24 DIAGNOSIS — O3680X0 Pregnancy with inconclusive fetal viability, not applicable or unspecified: Secondary | ICD-10-CM

## 2018-01-25 ENCOUNTER — Other Ambulatory Visit: Payer: Self-pay | Admitting: Obstetrics & Gynecology

## 2018-01-25 ENCOUNTER — Ambulatory Visit (INDEPENDENT_AMBULATORY_CARE_PROVIDER_SITE_OTHER): Payer: Medicaid Other

## 2018-01-25 DIAGNOSIS — Z3A08 8 weeks gestation of pregnancy: Secondary | ICD-10-CM | POA: Diagnosis not present

## 2018-01-25 DIAGNOSIS — O3680X0 Pregnancy with inconclusive fetal viability, not applicable or unspecified: Secondary | ICD-10-CM | POA: Diagnosis not present

## 2018-01-25 NOTE — Progress Notes (Signed)
US TV:8 +1 wks,single IUP,positive fetal heart tones 144 bpm,crown rump length 12.0 mm,GS 30.9 mm,homogeneous anteverted uterus,normal ovaries bilateral,EDD 09/05/2018

## 2018-02-09 ENCOUNTER — Other Ambulatory Visit: Payer: Medicaid Other

## 2018-02-09 ENCOUNTER — Other Ambulatory Visit: Payer: Self-pay | Admitting: Women's Health

## 2018-02-09 ENCOUNTER — Encounter: Payer: Self-pay | Admitting: Women's Health

## 2018-02-09 ENCOUNTER — Ambulatory Visit: Payer: Medicaid Other | Admitting: *Deleted

## 2018-02-09 ENCOUNTER — Ambulatory Visit (INDEPENDENT_AMBULATORY_CARE_PROVIDER_SITE_OTHER): Payer: Medicaid Other | Admitting: Women's Health

## 2018-02-09 VITALS — BP 120/72 | HR 78 | Wt 224.0 lb

## 2018-02-09 DIAGNOSIS — Z3A10 10 weeks gestation of pregnancy: Secondary | ICD-10-CM

## 2018-02-09 DIAGNOSIS — Z3401 Encounter for supervision of normal first pregnancy, first trimester: Secondary | ICD-10-CM

## 2018-02-09 DIAGNOSIS — Z331 Pregnant state, incidental: Secondary | ICD-10-CM

## 2018-02-09 DIAGNOSIS — O3680X0 Pregnancy with inconclusive fetal viability, not applicable or unspecified: Secondary | ICD-10-CM

## 2018-02-09 DIAGNOSIS — O0990 Supervision of high risk pregnancy, unspecified, unspecified trimester: Secondary | ICD-10-CM | POA: Insufficient documentation

## 2018-02-09 DIAGNOSIS — Z3682 Encounter for antenatal screening for nuchal translucency: Secondary | ICD-10-CM

## 2018-02-09 DIAGNOSIS — Z1389 Encounter for screening for other disorder: Secondary | ICD-10-CM

## 2018-02-09 LAB — POCT URINALYSIS DIPSTICK
Blood, UA: NEGATIVE
Glucose, UA: NEGATIVE
KETONES UA: NEGATIVE
LEUKOCYTES UA: NEGATIVE
NITRITE UA: NEGATIVE
PROTEIN UA: NEGATIVE

## 2018-02-09 NOTE — Progress Notes (Signed)
INITIAL OBSTETRICAL VISIT Patient name: Carol Hunt MRN [id number]  Date of birth: 1991-03-08 Chief Complaint:   Initial Prenatal Visit  History of Present Illness:   Carol Hunt is a 27 y.o. G1P0 Caucasian female at [redacted]w[redacted]d by LMP c/w 7wk u/s, with an Estimated Date of Delivery: 09/05/18 being seen today for her initial obstetrical visit.   Her obstetrical history is significant for primigravida.   Today she reports no complaints.  Patient's last menstrual period was 11/29/2017 (exact date). Last pap couple of years ago at [facility]. Results were: normal Review of Systems:   Pertinent items are noted in HPI Denies cramping/contractions, leakage of fluid, vaginal bleeding, abnormal vaginal discharge w/ itching/odor/irritation, headaches, visual changes, shortness of breath, chest pain, abdominal pain, severe nausea/vomiting, or problems with urination or bowel movements unless otherwise stated above.  Pertinent History Reviewed:  Reviewed past medical,surgical, social, obstetrical and family history.  Reviewed problem list, medications and allergies. OB History  Gravida Para Term Preterm AB Living  1            SAB TAB Ectopic Multiple Live Births               # Outcome Date GA Lbr Len/2nd Weight Sex Delivery Anes PTL Lv  1 Current            Physical Assessment:   Vitals:   02/09/18 1447  BP: 120/72  Pulse: 78  Weight: 224 lb (101.6 kg)  Body mass index is 39.68 kg/m.       Physical Examination:  General appearance - well appearing, and in no distress  Mental status - alert, oriented to person, place, and time  Psych:  She has a normal mood and affect  Skin - warm and dry, normal color, no suspicious lesions noted  Chest - effort normal, all lung fields clear to auscultation bilaterally  Heart - normal rate and regular rhythm  Abdomen - soft, nontender  Extremities:  No swelling or varicosities noted  Thin prep pap is not done   Fetal Heart Rate (bpm): 171 u/s via  informal transabdominal u/s  Results for orders placed or performed in visit on 02/09/18 (from the past 24 hour(s))  POCT urinalysis dipstick   Collection Time: 02/09/18  3:44 PM  Result Value Ref Range   Color, UA     Clarity, UA     Glucose, UA neg    Bilirubin, UA     Ketones, UA neg    Spec Grav, UA  1.010 - 1.025   Blood, UA neg    pH, UA  5.0 - 8.0   Protein, UA neg    Urobilinogen, UA  0.2 or 1.0 E.U./dL   Nitrite, UA neg    Leukocytes, UA Negative Negative   Appearance     Odor      Assessment & Plan:  1) Low-Risk Pregnancy G1P0 at [redacted]w[redacted]d with an Estimated Date of Delivery: 09/05/18   2) Initial OB visit   Meds: No orders of the defined types were placed in this encounter.   Initial labs obtained Continue prenatal vitamins Reviewed n/v relief measures and warning s/s to report Reviewed recommended weight gain based on pre-gravid BMI Encouraged well-balanced diet Genetic Screening discussed Integrated Screen: requested Cystic fibrosis screening discussed requested Ultrasound discussed; fetal survey: requested CCNC completed> spoke w/ Tobi Bastos  Follow-up: Return in about 3 weeks (around 03/02/2018) for LROB, US:NT+1stIT.   Orders Placed This Encounter  Procedures  . GC/Chlamydia Probe  Amp  . Urine Culture  . US Fetal Nuchal Translucency Measurement  . Cystic Fibrosis Mutation 97  . Obstetric Panel, Including HIV  . Urinalysis, Routine w reflex microscopic  . Pain Management Screening Profile (10S)  . POCT urinalysis dipstick    Cheral Marker CNM, [facility] 02/09/2018 4:29 PM

## 2018-02-09 NOTE — Patient Instructions (Signed)
Carol Hunt, I greatly value your feedback.  If you receive a survey following your visit with [place] today, we appreciate you taking the time to fill it out.  Thanks, [name], CNM, WHNP-BC   Nausea & Vomiting  Have saltine crackers or pretzels by your bed and eat a few bites before you raise your head out of bed in the morning  Eat small frequent meals throughout the day instead of large meals  Drink plenty of fluids throughout the day to stay hydrated, just don't drink a lot of fluids with your meals.  This can make your stomach fill up faster making you feel sick  Do not brush your teeth right after you eat  Products with real ginger are good for nausea, like ginger ale and ginger hard candy Make sure it says made with real ginger!  Sucking on sour candy like lemon heads is also good for nausea  If your prenatal vitamins make you nauseated, take them at night so you will sleep through the nausea  Sea Bands  If you feel like you need medicine for the nausea & vomiting please let us know  If you are unable to keep any fluids or food down please let us know   Constipation  Drink plenty of fluid, preferably water, throughout the day  Eat foods high in fiber such as fruits, vegetables, and grains  Exercise, such as walking, is a good way to keep your bowels regular  Drink warm fluids, especially warm prune juice, or decaf coffee  Eat a 1/2 cup of real oatmeal (not instant), 1/2 cup applesauce, and 1/2-1 cup warm prune juice every day  If needed, you may take Colace (docusate sodium) stool softener once or twice a day to help keep the stool soft. If you are pregnant, wait until you are out of your first trimester (12-14 weeks of pregnancy)  If you still are having problems with constipation, you may take Miralax once daily as needed to help keep your bowels regular.  If you are pregnant, wait until you are out of your first trimester (12-14 weeks of pregnancy)   First  Trimester of Pregnancy The first trimester of pregnancy is from week 1 until the end of week 12 (months 1 through 3). A week after a sperm fertilizes an egg, the egg will implant on the wall of the uterus. This embryo will begin to develop into a baby. Genes from you and your partner are forming the baby. The female genes determine whether the baby is a boy or a girl. At 6-8 weeks, the eyes and face are formed, and the heartbeat can be seen on ultrasound. At the end of 12 weeks, all the baby's organs are formed.  Now that you are pregnant, you will want to do everything you can to have a healthy baby. Two of the most important things are to get good prenatal care and to follow your health care provider's instructions. Prenatal care is all the medical care you receive before the baby's birth. This care will help prevent, find, and treat any problems during the pregnancy and childbirth. BODY CHANGES Your body goes through many changes during pregnancy. The changes vary from woman to woman.   You may gain or lose a couple of pounds at first.  You may feel sick to your stomach (nauseous) and throw up (vomit). If the vomiting is uncontrollable, call your health care provider.  You may tire easily.  You may develop headaches  that can be relieved by medicines approved by your health care provider.  You may urinate more often. Painful urination may mean you have a bladder infection.  You may develop heartburn as a result of your pregnancy.  You may develop constipation because certain hormones are causing the muscles that push waste through your intestines to slow down.  You may develop hemorrhoids or swollen, bulging veins (varicose veins).  Your breasts may begin to grow larger and become tender. Your nipples may stick out more, and the tissue that surrounds them (areola) may become darker.  Your gums may bleed and may be sensitive to brushing and flossing.  Dark spots or blotches (chloasma, mask  of pregnancy) may develop on your face. This will likely fade after the baby is born.  Your menstrual periods will stop.  You may have a loss of appetite.  You may develop cravings for certain kinds of food.  You may have changes in your emotions from day to day, such as being excited to be pregnant or being concerned that something may go wrong with the pregnancy and baby.  You may have more vivid and strange dreams.  You may have changes in your hair. These can include thickening of your hair, rapid growth, and changes in texture. Some women also have hair loss during or after pregnancy, or hair that feels dry or thin. Your hair will most likely return to normal after your baby is born. WHAT TO EXPECT AT YOUR PRENATAL VISITS During a routine prenatal visit:  You will be weighed to make sure you and the baby are growing normally.  Your blood pressure will be taken.  Your abdomen will be measured to track your baby's growth.  The fetal heartbeat will be listened to starting around week 10 or 12 of your pregnancy.  Test results from any previous visits will be discussed. Your health care provider may ask you:  How you are feeling.  If you are feeling the baby move.  If you have had any abnormal symptoms, such as leaking fluid, bleeding, severe headaches, or abdominal cramping.  If you have any questions. Other tests that may be performed during your first trimester include:  Blood tests to find your blood type and to check for the presence of any previous infections. They will also be used to check for low iron levels (anemia) and Rh antibodies. Later in the pregnancy, blood tests for diabetes will be done along with other tests if problems develop.  Urine tests to check for infections, diabetes, or protein in the urine.  An ultrasound to confirm the proper growth and development of the baby.  An amniocentesis to check for possible genetic problems.  Fetal screens for spina  bifida and Down syndrome.  You may need other tests to make sure you and the baby are doing well. HOME CARE INSTRUCTIONS  Medicines  Follow your health care provider's instructions regarding medicine use. Specific medicines may be either safe or unsafe to take during pregnancy.  Take your prenatal vitamins as directed.  If you develop constipation, try taking a stool softener if your health care provider approves. Diet  Eat regular, well-balanced meals. Choose a variety of foods, such as meat or vegetable-based protein, fish, milk and low-fat dairy products, vegetables, fruits, and whole grain breads and cereals. Your health care provider will help you determine the amount of weight gain that is right for you.  Avoid raw meat and uncooked cheese. These carry germs that can  cause birth defects in the baby.  Eating four or five small meals rather than three large meals a day may help relieve nausea and vomiting. If you start to feel nauseous, eating a few soda crackers can be helpful. Drinking liquids between meals instead of during meals also seems to help nausea and vomiting.  If you develop constipation, eat more high-fiber foods, such as fresh vegetables or fruit and whole grains. Drink enough fluids to keep your urine clear or pale yellow. Activity and Exercise  Exercise only as directed by your health care provider. Exercising will help you:  Control your weight.  Stay in shape.  Be prepared for labor and delivery.  Experiencing pain or cramping in the lower abdomen or low back is a good sign that you should stop exercising. Check with your health care provider before continuing normal exercises.  Try to avoid standing for long periods of time. Move your legs often if you must stand in one place for a long time.  Avoid heavy lifting.  Wear low-heeled shoes, and practice good posture.  You may continue to have sex unless your health care provider directs you  otherwise. Relief of Pain or Discomfort  Wear a good support bra for breast tenderness.   Take warm sitz baths to soothe any pain or discomfort caused by hemorrhoids. Use hemorrhoid cream if your health care provider approves.   Rest with your legs elevated if you have leg cramps or low back pain.  If you develop varicose veins in your legs, wear support hose. Elevate your feet for 15 minutes, 3-4 times a day. Limit salt in your diet. Prenatal Care  Schedule your prenatal visits by the twelfth week of pregnancy. They are usually scheduled monthly at first, then more often in the last 2 months before delivery.  Write down your questions. Take them to your prenatal visits.  Keep all your prenatal visits as directed by your health care provider. Safety  Wear your seat belt at all times when driving.  Make a list of emergency phone numbers, including numbers for family, friends, the hospital, and police and fire departments. General Tips  Ask your health care provider for a referral to a local prenatal education class. Begin classes no later than at the beginning of month 6 of your pregnancy.  Ask for help if you have counseling or nutritional needs during pregnancy. Your health care provider can offer advice or refer you to specialists for help with various needs.  Do not use hot tubs, steam rooms, or saunas.  Do not douche or use tampons or scented sanitary pads.  Do not cross your legs for long periods of time.  Avoid cat litter boxes and soil used by cats. These carry germs that can cause birth defects in the baby and possibly loss of the fetus by miscarriage or stillbirth.  Avoid all smoking, herbs, alcohol, and medicines not prescribed by your health care provider. Chemicals in these affect the formation and growth of the baby.  Schedule a dentist appointment. At home, brush your teeth with a soft toothbrush and be gentle when you floss. SEEK MEDICAL CARE IF:   You have  dizziness.  You have mild pelvic cramps, pelvic pressure, or nagging pain in the abdominal area.  You have persistent nausea, vomiting, or diarrhea.  You have a bad smelling vaginal discharge.  You have pain with urination.  You notice increased swelling in your face, hands, legs, or ankles. SEEK IMMEDIATE MEDICAL CARE IF:  You have a fever.  You are leaking fluid from your vagina.  You have spotting or bleeding from your vagina.  You have severe abdominal cramping or pain.  You have rapid weight gain or loss.  You vomit blood or material that looks like coffee grounds.  You are exposed to [place] measles and have never had them.  You are exposed to fifth disease or chickenpox.  You develop a severe headache.  You have shortness of breath.  You have any kind of trauma, such as from a fall or a car accident. Document Released: 09/22/2001 Document Revised: 02/12/2014 Document Reviewed: 08/08/2013 [facility] Patient Information 2015 [place], [place]. This information is not intended to replace advice given to you by your health care provider. Make sure you discuss any questions you have with your health care provider.

## 2018-02-10 ENCOUNTER — Other Ambulatory Visit: Payer: Self-pay | Admitting: Women's Health

## 2018-02-10 LAB — PMP SCREEN PROFILE (10S), URINE
Amphetamine Scrn, Ur: NEGATIVE ng/mL
BARBITURATE SCREEN URINE: NEGATIVE ng/mL
BENZODIAZEPINE SCREEN, URINE: NEGATIVE ng/mL
CANNABINOIDS UR QL SCN: NEGATIVE ng/mL
Cocaine (Metab) Scrn, Ur: NEGATIVE ng/mL
Creatinine(Crt), U: 184.7 mg/dL (ref 20.0–300.0)
Methadone Screen, Urine: NEGATIVE ng/mL
OXYCODONE+OXYMORPHONE UR QL SCN: NEGATIVE ng/mL
Opiate Scrn, Ur: NEGATIVE ng/mL
Ph of Urine: 5.5 (ref 4.5–8.9)
Phencyclidine Qn, Ur: NEGATIVE ng/mL
Propoxyphene Scrn, Ur: NEGATIVE ng/mL

## 2018-02-10 LAB — MED LIST OPTION NOT SELECTED

## 2018-02-10 MED ORDER — FERROUS SULFATE 325 (65 FE) MG PO TABS: 325.0000 mg | ORAL_TABLET | Freq: Two times a day (BID) | ORAL | 3 refills | Status: DC

## 2018-02-11 LAB — URINE CULTURE

## 2018-02-11 LAB — GC/CHLAMYDIA PROBE AMP
Chlamydia trachomatis, NAA: POSITIVE — AB
Neisseria gonorrhoeae by PCR: NEGATIVE

## 2018-02-13 DIAGNOSIS — Z3401 Encounter for supervision of normal first pregnancy, first trimester: Secondary | ICD-10-CM

## 2018-02-14 ENCOUNTER — Encounter: Payer: Self-pay | Admitting: Women's Health

## 2018-02-14 ENCOUNTER — Other Ambulatory Visit: Payer: Self-pay | Admitting: Women's Health

## 2018-02-14 DIAGNOSIS — Z8619 Personal history of other infectious and parasitic diseases: Secondary | ICD-10-CM | POA: Insufficient documentation

## 2018-02-14 MED ORDER — AZITHROMYCIN 500 MG PO TABS: 1000.0000 mg | ORAL_TABLET | Freq: Once | ORAL | 0 refills | Status: AC

## 2018-02-15 ENCOUNTER — Other Ambulatory Visit: Payer: Self-pay | Admitting: Radiology

## 2018-02-15 LAB — OBSTETRIC PANEL, INCLUDING HIV
ANTIBODY SCREEN: NEGATIVE
BASOS ABS: 0 10*3/uL (ref 0.0–0.2)
Basos: 0 %
EOS (ABSOLUTE): 0.5 10*3/uL — AB (ref 0.0–0.4)
Eos: 4 %
HEP B S AG: NEGATIVE
HIV SCREEN 4TH GENERATION: NONREACTIVE
Hematocrit: 31.5 % — ABNORMAL LOW (ref 34.0–46.6)
Hemoglobin: 10.7 g/dL — ABNORMAL LOW (ref 11.1–15.9)
IMMATURE GRANULOCYTES: 0 %
Immature Grans (Abs): 0 10*3/uL (ref 0.0–0.1)
LYMPHS ABS: 4.1 10*3/uL — AB (ref 0.7–3.1)
Lymphs: 33 %
MCH: 29 pg (ref 26.6–33.0)
MCHC: 34 g/dL (ref 31.5–35.7)
MCV: 85 fL (ref 79–97)
MONOS ABS: 0.7 10*3/uL (ref 0.1–0.9)
Monocytes: 6 %
NEUTROS ABS: 7.1 10*3/uL — AB (ref 1.4–7.0)
NEUTROS PCT: 57 %
Platelets: 303 10*3/uL (ref 150–379)
RBC: 3.69 x10E6/uL — AB (ref 3.77–5.28)
RDW: 13.7 % (ref 12.3–15.4)
RPR Ser Ql: NONREACTIVE
Rh Factor: POSITIVE
Rubella Antibodies, IGG: 1 index (ref >0.99)
WBC: 12.4 10*3/uL — AB (ref 3.4–10.8)

## 2018-02-15 LAB — MICROSCOPIC EXAMINATION: CASTS: NONE SEEN /LPF

## 2018-02-15 LAB — URINALYSIS, ROUTINE W REFLEX MICROSCOPIC
Bilirubin, UA: NEGATIVE
Glucose, UA: NEGATIVE
Ketones, UA: NEGATIVE
Nitrite, UA: NEGATIVE
PROTEIN UA: NEGATIVE
RBC, UA: NEGATIVE
Urobilinogen, Ur: 0.2 mg/dL (ref 0.2–1.0)
pH, UA: 5 (ref 5.0–7.5)

## 2018-02-15 LAB — CYSTIC FIBROSIS MUTATION 97: Interpretation: NOT DETECTED

## 2018-02-16 ENCOUNTER — Encounter: Payer: Self-pay | Admitting: Women's Health

## 2018-03-02 ENCOUNTER — Ambulatory Visit (INDEPENDENT_AMBULATORY_CARE_PROVIDER_SITE_OTHER): Payer: Medicaid Other | Admitting: Advanced Practice Midwife

## 2018-03-02 ENCOUNTER — Ambulatory Visit (INDEPENDENT_AMBULATORY_CARE_PROVIDER_SITE_OTHER): Payer: Medicaid Other

## 2018-03-02 VITALS — BP 110/62 | HR 82 | Wt 226.0 lb

## 2018-03-02 DIAGNOSIS — Z363 Encounter for antenatal screening for malformations: Secondary | ICD-10-CM

## 2018-03-02 DIAGNOSIS — Z3A13 13 weeks gestation of pregnancy: Secondary | ICD-10-CM

## 2018-03-02 DIAGNOSIS — Z3682 Encounter for antenatal screening for nuchal translucency: Secondary | ICD-10-CM

## 2018-03-02 DIAGNOSIS — Z3401 Encounter for supervision of normal first pregnancy, first trimester: Secondary | ICD-10-CM

## 2018-03-02 DIAGNOSIS — Z1389 Encounter for screening for other disorder: Secondary | ICD-10-CM

## 2018-03-02 DIAGNOSIS — Z331 Pregnant state, incidental: Secondary | ICD-10-CM

## 2018-03-02 LAB — POCT URINALYSIS DIPSTICK
Blood, UA: NEGATIVE
Glucose, UA: NEGATIVE
Ketones, UA: NEGATIVE
Leukocytes, UA: NEGATIVE
Nitrite, UA: NEGATIVE
Protein, UA: NEGATIVE

## 2018-03-02 NOTE — Progress Notes (Signed)
[place] 13+2 wks,measurements c/w dates,NT 1.5 mm,NB present,CRL 71.4 mm,normal ovaries bilat,fhr 162 bpm

## 2018-03-02 NOTE — Progress Notes (Signed)
  G1P0 [redacted]w[redacted]d Estimated Date of Delivery: 09/05/18  Blood pressure 110/62, pulse 82, weight 226 lb (102.5 kg), last menstrual period 11/29/2017.   BP weight and urine results all reviewed and noted.  Please refer to the obstetrical flow sheet for the fundal height and fetal heart rate documentation:  Patient reports good fetal movement, denies any bleeding and no rupture of membranes symptoms or regular contractions. Patient is without complaints. All questions were answered.   Physical Assessment:   Vitals:   03/02/18 1603  BP: 110/62  Pulse: 82  Weight: 226 lb (102.5 kg)  Body mass index is 40.03 kg/m.        Physical Examination:   General appearance: Well appearing, and in no distress  Mental status: Alert, oriented to person, place, and time  Skin: Warm & dry  Cardiovascular: Normal heart rate noted  Respiratory: Normal respiratory effort, no distress  Abdomen: Soft, gravid, nontender  Pelvic: Cervical exam deferred         Extremities: Edema: None  Fetal Status:     Movement: Absent    Results for orders placed or performed in visit on 03/02/18 (from the past 24 hour(s))  POCT Urinalysis Dipstick   Collection Time: 03/02/18  4:11 PM  Result Value Ref Range   Color, UA     Clarity, UA     Glucose, UA Negative Negative   Bilirubin, UA     Ketones, UA neg    Spec Grav, UA  1.010 - 1.025   Blood, UA neg    pH, UA  5.0 - 8.0   Protein, UA Negative Negative   Urobilinogen, UA  0.2 or 1.0 E.U./dL   Nitrite, UA neg    Leukocytes, UA Negative Negative   Appearance     Odor     [place] 13+2 wks,measurements c/w dates,NT 1.5 mm,NB present,CRL 71.4 mm,normal ovaries bilat,fhr 162 bpm    Orders Placed This Encounter  Procedures  . US OB Comp + 14 Wk  . Integrated 1  . POCT Urinalysis Dipstick    Plan:  Continued routine obstetrical care,   Return in about 5 weeks (around 04/06/2018) for LROB, [id number].

## 2018-03-02 NOTE — Patient Instructions (Signed)
Carol Hunt, I greatly value your feedback.  If you receive a survey following your visit with [place] today, we appreciate you taking the time to fill it out.  Thanks, Carol Hunt, CNM     Second Trimester of Pregnancy The second trimester is from week 14 through week 27 (months 4 through 6). The second trimester is often a time when you feel your best. Your body has adjusted to being pregnant, and you begin to feel better physically. Usually, morning sickness has lessened or quit completely, you may have more energy, and you may have an increase in appetite. The second trimester is also a time when the fetus is growing rapidly. At the end of the sixth month, the fetus is about 9 inches long and weighs about 1 pounds. You will likely begin to feel the baby move (quickening) between 16 and 20 weeks of pregnancy. Body changes during your second trimester Your body continues to go through many changes during your second trimester. The changes vary from woman to woman.  Your weight will continue to increase. You will notice your lower abdomen bulging out.  You may begin to get stretch marks on your hips, abdomen, and breasts.  You may develop headaches that can be relieved by medicines. The medicines should be approved by your health care provider.  You may urinate more often because the fetus is pressing on your bladder.  You may develop or continue to have heartburn as a result of your pregnancy.  You may develop constipation because certain hormones are causing the muscles that push waste through your intestines to slow down.  You may develop hemorrhoids or swollen, bulging veins (varicose veins).  You may have back pain. This is caused by: ? Weight gain. ? Pregnancy hormones that are relaxing the joints in your pelvis. ? A shift in weight and the muscles that support your balance.  Your breasts will continue to grow and they will continue to become tender.  Your gums may  bleed and may be sensitive to brushing and flossing.  Dark spots or blotches (chloasma, mask of pregnancy) may develop on your face. This will likely fade after the baby is born.  A dark line from your belly button to the pubic area (linea nigra) may appear. This will likely fade after the baby is born.  You may have changes in your hair. These can include thickening of your hair, rapid growth, and changes in texture. Some women also have hair loss during or after pregnancy, or hair that feels dry or thin. Your hair will most likely return to normal after your baby is born.  What to expect at prenatal visits During a routine prenatal visit:  You will be weighed to make sure you and the fetus are growing normally.  Your blood pressure will be taken.  Your abdomen will be measured to track your baby's growth.  The fetal heartbeat will be listened to.  Any test results from the previous visit will be discussed.  Your health care provider may ask you:  How you are feeling.  If you are feeling the baby move.  If you have had any abnormal symptoms, such as leaking fluid, bleeding, severe headaches, or abdominal cramping.  If you are using any tobacco products, including cigarettes, chewing tobacco, and electronic cigarettes.  If you have any questions.  Other tests that may be performed during your second trimester include:  Blood tests that check for: ? Low iron levels (anemia). ?  High blood sugar that affects pregnant women (gestational diabetes) between 20 and 28 weeks. ? Rh antibodies. This is to check for a protein on red blood cells (Rh factor).  Urine tests to check for infections, diabetes, or protein in the urine.  An ultrasound to confirm the proper growth and development of the baby.  An amniocentesis to check for possible genetic problems.  Fetal screens for spina bifida and Down syndrome.  HIV (human immunodeficiency virus) testing. Routine prenatal testing  includes screening for HIV, unless you choose not to have this test.  Follow these instructions at home: Medicines  Follow your health care provider's instructions regarding medicine use. Specific medicines may be either safe or unsafe to take during pregnancy.  Take a prenatal vitamin that contains at least 600 micrograms (mcg) of folic acid.  If you develop constipation, try taking a stool softener if your health care provider approves. Eating and drinking  Eat a balanced diet that includes fresh fruits and vegetables, whole grains, good sources of protein such as meat, eggs, or tofu, and low-fat dairy. Your health care provider will help you determine the amount of weight gain that is right for you.  Avoid raw meat and uncooked cheese. These carry germs that can cause birth defects in the baby.  If you have low calcium intake from food, talk to your health care provider about whether you should take a daily calcium supplement.  Limit foods that are high in fat and processed sugars, such as fried and sweet foods.  To prevent constipation: ? Drink enough fluid to keep your urine clear or pale yellow. ? Eat foods that are high in fiber, such as fresh fruits and vegetables, whole grains, and beans. Activity  Exercise only as directed by your health care provider. Most women can continue their usual exercise routine during pregnancy. Try to exercise for 30 minutes at least 5 days a week. Stop exercising if you experience uterine contractions.  Avoid heavy lifting, wear low heel shoes, and practice good posture.  A sexual relationship may be continued unless your health care provider directs you otherwise. Relieving pain and discomfort  Wear a good support bra to prevent discomfort from breast tenderness.  Take warm sitz baths to soothe any pain or discomfort caused by hemorrhoids. Use hemorrhoid cream if your health care provider approves.  Rest with your legs elevated if you have  leg cramps or low back pain.  If you develop varicose veins, wear support hose. Elevate your feet for 15 minutes, 3-4 times a day. Limit salt in your diet. Prenatal Care  Write down your questions. Take them to your prenatal visits.  Keep all your prenatal visits as told by your health care provider. This is important. Safety  Wear your seat belt at all times when driving.  Make a list of emergency phone numbers, including numbers for family, friends, the hospital, and police and fire departments. General instructions  Ask your health care provider for a referral to a local prenatal education class. Begin classes no later than the beginning of month 6 of your pregnancy.  Ask for help if you have counseling or nutritional needs during pregnancy. Your health care provider can offer advice or refer you to specialists for help with various needs.  Do not use hot tubs, steam rooms, or saunas.  Do not douche or use tampons or scented sanitary pads.  Do not cross your legs for long periods of time.  Avoid cat litter boxes and  soil used by cats. These carry germs that can cause birth defects in the baby and possibly loss of the fetus by miscarriage or stillbirth.  Avoid all smoking, herbs, alcohol, and unprescribed drugs. Chemicals in these products can affect the formation and growth of the baby.  Do not use any products that contain nicotine or tobacco, such as cigarettes and e-cigarettes. If you need help quitting, ask your health care provider.  Visit your dentist if you have not gone yet during your pregnancy. Use a soft toothbrush to brush your teeth and be gentle when you floss. Contact a health care provider if:  You have dizziness.  You have mild pelvic cramps, pelvic pressure, or nagging pain in the abdominal area.  You have persistent nausea, vomiting, or diarrhea.  You have a bad smelling vaginal discharge.  You have pain when you urinate. Get help right away if:  You  have a fever.  You are leaking fluid from your vagina.  You have spotting or bleeding from your vagina.  You have severe abdominal cramping or pain.  You have rapid weight gain or weight loss.  You have shortness of breath with chest pain.  You notice sudden or extreme swelling of your face, hands, ankles, feet, or legs.  You have not felt your baby move in over an hour.  You have severe headaches that do not go away when you take medicine.  You have vision changes. Summary  The second trimester is from week 14 through week 27 (months 4 through 6). It is also a time when the fetus is growing rapidly.  Your body goes through many changes during pregnancy. The changes vary from woman to woman.  Avoid all smoking, herbs, alcohol, and unprescribed drugs. These chemicals affect the formation and growth your baby.  Do not use any tobacco products, such as cigarettes, chewing tobacco, and e-cigarettes. If you need help quitting, ask your health care provider.  Contact your health care provider if you have any questions. Keep all prenatal visits as told by your health care provider. This is important. This information is not intended to replace advice given to you by your health care provider. Make sure you discuss any questions you have with your health care provider.      CHILDBIRTH CLASSES [phone] is the phone number for Pregnancy Classes or hospital tours at [place] will be referred to  [email or web address] for more information on childbirth classes  At this site you may register for classes. You may sign up for a waiting list if classes are full. Please SIGN UP FOR THIS!.   When the waiting list becomes long, sometimes new classes can be added.

## 2018-03-04 LAB — INTEGRATED 1
Crown Rump Length: 71.4 mm
Gest. Age on Collection Date: 13.1 weeks
Maternal Age at EDD: 26.5 yr
Nuchal Translucency (NT): 1.5 mm
Number of Fetuses: 1
PAPP-A VALUE: 463.1 ng/mL
Weight: 226 [lb_av]

## 2018-03-09 ENCOUNTER — Encounter: Payer: Self-pay | Admitting: Women's Health

## 2018-03-30 ENCOUNTER — Encounter: Payer: Self-pay | Admitting: *Deleted

## 2018-04-06 ENCOUNTER — Encounter: Payer: Self-pay | Admitting: Obstetrics and Gynecology

## 2018-04-06 ENCOUNTER — Ambulatory Visit (INDEPENDENT_AMBULATORY_CARE_PROVIDER_SITE_OTHER): Payer: Medicaid Other

## 2018-04-06 ENCOUNTER — Ambulatory Visit (INDEPENDENT_AMBULATORY_CARE_PROVIDER_SITE_OTHER): Payer: Medicaid Other | Admitting: Obstetrics and Gynecology

## 2018-04-06 VITALS — BP 123/70 | HR 98 | Wt 225.4 lb

## 2018-04-06 DIAGNOSIS — Z331 Pregnant state, incidental: Secondary | ICD-10-CM

## 2018-04-06 DIAGNOSIS — Z3402 Encounter for supervision of normal first pregnancy, second trimester: Secondary | ICD-10-CM

## 2018-04-06 DIAGNOSIS — Z3A18 18 weeks gestation of pregnancy: Secondary | ICD-10-CM

## 2018-04-06 DIAGNOSIS — Z1389 Encounter for screening for other disorder: Secondary | ICD-10-CM

## 2018-04-06 DIAGNOSIS — Z363 Encounter for antenatal screening for malformations: Secondary | ICD-10-CM | POA: Diagnosis not present

## 2018-04-06 LAB — POCT URINALYSIS DIPSTICK
Blood, UA: NEGATIVE
Glucose, UA: NEGATIVE
Ketones, UA: NEGATIVE
LEUKOCYTES UA: NEGATIVE
Nitrite, UA: NEGATIVE
Protein, UA: POSITIVE — AB

## 2018-04-06 MED ORDER — CEPHALEXIN 500 MG PO CAPS: 500.0000 mg | ORAL_CAPSULE | Freq: Four times a day (QID) | ORAL | 0 refills | Status: DC

## 2018-04-06 NOTE — Patient Instructions (Signed)
CHILDBIRTH CLASSES ° °[facility] °Call to Register: [phone] or [phone] or Register Online: [email or web address] ° °THESE CLASSES FILL UP VERY QUICKLY, SO SIGN UP AS SOON AS YOU CAN!!! ° °*Please visit [facility] pregnancy website at [email or web address]* ° °Option 1: Birth & Baby Series °• This series of 3 weekly classes helps you and your labor partner prepare for childbirth at [facility]. °• Reviews newborn care, labor & birth, pain management, and comfort techniques °• Maternity Care Center Tour of [facility] is included. °• Cost: $60 per couple for insured or self-pay, $30 per couple for Medicaid ° °Option 2: Weekend Birth & Baby °• This class is a weekend version of our Birth & Baby series. It is designed for parents who have a difficult time fitting several weeks of classes into their schedule.  °• Maternity Care Center Tour of [facility] is included.  °• Friday 6:30pm-8:30pm, Saturday 9am-4pm °• Cost: $75 per couple for insured or self-pay, $30 per couple for Medicaid ° °Option 3: Natural Childbirth °• This series of 5 weekly classes is for expectant parents who want to learn and practice natural methods of coping with the process of labor and childbirth. °• Maternity Care Center Tour of [facility] is included. °• Cost: $75 per couple for insured or self-pay, $30 per couple for Medicaid ° °Option 4: Online Birth & Baby °• This online class offers you the freedom to complete a Birth & Baby series in the comfort of your own home. The flexibility of this option allows you to review sections at your own place, at times convenient to you and your support people.  °• Cost: $60 for 60 days of online access ° ° ° °Other Available Classes °Baby & Me °Enjoy this time to discuss newborn & infant parenting topics and family adjustment issues with other new mothers in a relaxed environment. Each week brings a new speaker or baby-centered activity. We encourage  mothers and their babies (birth to crawling) to join us every Thursday in the [facility] at 11:00 am. You are welcome to visit this group even if you haven't delivered yet! It's wonderful to make new friends early and watch other moms interact with their babies. No registration or fee.  ° °Big Brother/ Big Sister °Let your children share in the joy of a new brother or sister in this special class designed just for them. This class is designed for children ages 2-6, but any age is welcome. Please register each child individually. ° °Breastfeeding Support Group °This group is a mother-to-mother support circle where moms have the opportunity to share their breastfeeding experiences. A breastfeeding Support nurse is present for questions and concerns. Meets each Tuesday at 11:00 am. No fee or registration. ° °Breastfeeding Your Baby °Breastfeeding is a special time for mother and child. This class will help you feel ready to begin this important relationship. Your partner is encouraged to attend with you.  ° °Caring For Baby °This class is for expectant  and adoptive parents who want to learn and practice the most up-to-date newborn care for their babies. Register only the mom-to-be and your partner can come with you. (Note: This class is included in the Birth & Baby series and the Weekend Birth & Baby classes.) ° °Comfort Techniques & Tour °This 2-hour interactive class will provide you the opportunity to learn & practice hands-on techniques with your partner that can help relieve some discomfort of labor and encourage your baby to rotate toward   the best position for birth. A tour of the [facility] is included.  ° °Daddy Boot Camp °This course offers Dads-to-be the tools and knowledge needed to feel confident on their journey to becoming new fathers. ° °Grandparent Love °Expecting a grandbaby? Learn about the latest infant care and safety recommendation and ways to  support your own child as he or she transitions into the parenting role.  ° °Infant and Child CPR °Parents, grandparents, babysitters, and friends learn Cardio-Pulmonary Resuscitation skills for infants and children. Register each participant individually. (Note: This Family & Friends program does not offer certification.) ° °Marvelous Multiples °Expecting twins, triplets, or more? This class covers the differences in labor, birth, parenting, and breastfeeding issues that face multiples' parents. NICU tour is included.  ° °Mom Talk °This mom-led group offers support and connection to mothers as they journey through the adjustments and struggles of that sometimes overwhelming first year after the birth of a child. A member of our [facility] staff will be present to share resources and additional support if needed, as you care for yourself and baby. You are welcome to visit the group before you deliver! It's wonderful to meet new friends early and watch other moms interact with their babies. It's held at [facility] at 10:00 am each Tuesday morning and 6:00 pm each Thursday evening. Babies (birth to crawling) welcome. No registration or fee.  ° °Waterbirth Classes °Interested in a waterbirth? This informational class will help you discover whether waterbirth is the right fir for you.  ° °[facility] Virtual Maternity Tour °View a virtual tour of [facility]. In-person tours are available for participants of childbirth education classes.  ° °

## 2018-04-06 NOTE — Progress Notes (Signed)
Patient ID: Carol Hunt, female   DOB: 05/05/1991, 27 y.o.   MRN: [id number]   LOW-RISK PREGNANCY VISIT Patient name: Carol Hunt MRN [id number]  Date of birth: July 07, 1991 Chief Complaint:   low risk ob (ultrasound)  History of Present Illness:   Carol Hunt is a 27 y.o. G1P0 female at [redacted]w[redacted]d with an Estimated Date of Delivery: 09/05/18 being seen today for ongoing management of a low-risk pregnancy.  Today she reports an infected wound to her left little finger. She is concerned that it could cause harm to the baby long term, pt reassured. She has started tco notice swelling to her bilateral ankles. She would like to start using an elliptical for exercise. She is flying to [facility] in August and wanted to make sure she will still be able to fly at that time.   .  .  Movement: Present. denies leaking of fluid. Review of Systems:   Pertinent items are noted in HPI Denies abnormal vaginal discharge w/ itching/odor/irritation, headaches, visual changes, shortness of breath, chest pain, abdominal pain, severe nausea/vomiting, or problems with urination or bowel movements unless otherwise stated above. Pertinent History Reviewed:  Reviewed past medical,surgical, social, obstetrical and family history.  Reviewed problem list, medications and allergies. Physical Assessment:   Vitals:   04/06/18 1551  BP: 123/70  Pulse: 98  Weight: 225 lb 6.4 oz (102.2 kg)  Body mass index is 39.93 kg/m.        Physical Examination:   General appearance: Well appearing, and in no distress  Mental status: Alert, oriented to person, place, and time  Skin: Warm & dry  Cardiovascular: Normal heart rate noted  Respiratory: Normal respiratory effort, no distress  Abdomen: Soft, gravid, nontender  Pelvic: Cervical exam deferred         Extremities: Edema: Trace  Fetal Status: Fetal Heart Rate (bpm): 154 u/s   Movement: Present    Results for orders placed or performed in visit on 04/06/18 (from the  past 24 hour(s))  POCT urinalysis dipstick   Collection Time: 04/06/18  4:04 PM  Result Value Ref Range   Color, UA     Clarity, UA     Glucose, UA Negative Negative   Bilirubin, UA     Ketones, UA neg    Spec Grav, UA  1.010 - 1.025   Blood, UA neg    pH, UA  5.0 - 8.0   Protein, UA Positive (A) Negative   Urobilinogen, UA  0.2 or 1.0 E.U./dL   Nitrite, UA neg    Leukocytes, UA Negative Negative   Appearance     Odor      Assessment & Plan:  1) Low-risk pregnancy G1P0 at [redacted]w[redacted]d with an Estimated Date of Delivery: 09/05/18 Jana[place] Bridgeman travel,   2) nail infection. Rx Keflex 500 qid x 7d for nail infection left little finger.  3) Water exercises   Meds: No orders of the defined types were placed in this encounter.  Labs/procedures today: [place] 18+2 wks,breech,anterior pl gr 0,cx 2.8 cm,normal ovaries bilat,svp of fluid 4.7 cm,fhr 154 bpm,EFW 215 g 24%,limited view of spine and heart because of pt body habitus and fetal position,please have pt come back for additional images,no obvious abnormalities   Plan:  Continue routine obstetrical care   Follow-up: Return in about 1 month (around 05/04/2018) for LROB, U/S: ANATOMY.  Orders Placed This Encounter  Procedures  . POCT urinalysis dipstick   By signing my name below, I, Diona Browner,  attest that this documentation has been prepared under the direction and in the presence of Tilda BurrowFerguson, Verdie Wilms V, MD. Electronically Signed: Diona BrownerJennifer Gorman, Medical Scribe. 04/06/18. 4:29 PM.  I personally performed the services described in this documentation, which was SCRIBED in my presence. The recorded information has been reviewed and considered accurate. It has been edited as necessary during review. Tilda BurrowJohn V Bonni Neuser, MD

## 2018-04-06 NOTE — Progress Notes (Signed)
US 18+2 wks,breech,anterior pl gr 0,cx 2.8 cm,normal ovaries bilat,svp of fluid 4.7 cm,fhr 154 bpm,EFW 215 g 24%,limited view of spine and heart because of pt body habitus and fetal position,please have pt come back for additional images,no obvious abnormalities

## 2018-05-03 ENCOUNTER — Other Ambulatory Visit: Payer: Self-pay | Admitting: Obstetrics and Gynecology

## 2018-05-03 DIAGNOSIS — Z0489 Encounter for examination and observation for other specified reasons: Secondary | ICD-10-CM

## 2018-05-03 DIAGNOSIS — IMO0002 Reserved for concepts with insufficient information to code with codable children: Secondary | ICD-10-CM

## 2018-05-04 ENCOUNTER — Other Ambulatory Visit (HOSPITAL_COMMUNITY)
Admission: RE | Admit: 2018-05-04 | Discharge: 2018-05-04 | Disposition: A | Discharge status: Home / Self Care | Payer: Medicaid Other | Source: Ambulatory Visit | Attending: Advanced Practice Midwife | Admitting: Advanced Practice Midwife

## 2018-05-04 ENCOUNTER — Ambulatory Visit (INDEPENDENT_AMBULATORY_CARE_PROVIDER_SITE_OTHER): Payer: Medicaid Other

## 2018-05-04 ENCOUNTER — Ambulatory Visit (INDEPENDENT_AMBULATORY_CARE_PROVIDER_SITE_OTHER): Payer: Medicaid Other | Admitting: Advanced Practice Midwife

## 2018-05-04 ENCOUNTER — Encounter: Payer: Self-pay | Admitting: Advanced Practice Midwife

## 2018-05-04 VITALS — BP 123/70 | HR 84 | Wt 227.0 lb

## 2018-05-04 DIAGNOSIS — Z3A22 22 weeks gestation of pregnancy: Secondary | ICD-10-CM | POA: Diagnosis not present

## 2018-05-04 DIAGNOSIS — Z331 Pregnant state, incidental: Secondary | ICD-10-CM

## 2018-05-04 DIAGNOSIS — Z124 Encounter for screening for malignant neoplasm of cervix: Secondary | ICD-10-CM | POA: Insufficient documentation

## 2018-05-04 DIAGNOSIS — IMO0002 Reserved for concepts with insufficient information to code with codable children: Secondary | ICD-10-CM

## 2018-05-04 DIAGNOSIS — Z3402 Encounter for supervision of normal first pregnancy, second trimester: Secondary | ICD-10-CM

## 2018-05-04 DIAGNOSIS — Z0489 Encounter for examination and observation for other specified reasons: Secondary | ICD-10-CM

## 2018-05-04 DIAGNOSIS — Z1389 Encounter for screening for other disorder: Secondary | ICD-10-CM

## 2018-05-04 LAB — POCT URINALYSIS DIPSTICK OB
Glucose, UA: NEGATIVE — AB
LEUKOCYTES UA: NEGATIVE
Nitrite, UA: NEGATIVE
PROTEIN: NEGATIVE
RBC UA: NEGATIVE

## 2018-05-04 NOTE — Patient Instructions (Signed)

## 2018-05-04 NOTE — Progress Notes (Signed)
LOW-RISK PREGNANCY VISIT Patient name: Carol Hunt MRN [id number]  Date of birth: June 29, 1991 Chief Complaint:   low risk ob (ultrasound/ pap smear)  History of Present Illness:   Carol Hunt is a 27 y.o. G1P0 female at 3454w2d with an Estimated Date of Delivery: 09/05/18 being seen today for ongoing management of a low-risk pregnancy.  Today she reports no complaints.  .  .  Movement: Present. denies leaking of fluid. Review of Systems:   Pertinent items are noted in HPI Denies abnormal vaginal discharge w/ itching/odor/irritation, headaches, visual changes, shortness of breath, chest pain, abdominal pain, severe nausea/vomiting, or problems with urination or bowel movements unless otherwise stated above.  Pertinent History Reviewed:  Medical & Surgical Hx:   Past Medical History:  Diagnosis Date  . Medical history non-contributory    Past Surgical History:  Procedure Laterality Date  . MIDDLE EAR SURGERY    . TYMPANOPLASTY     Family History  Problem Relation Age of Onset  . Pancreatitis Father        living  . Diabetes Sister   . Cancer Maternal Aunt        breast   . Diabetes Paternal Aunt   . COPD Paternal Grandfather   . Colon cancer Neg Hx     Current Outpatient Medications:  .  ferrous sulfate 325 (65 FE) MG tablet, Take 1 tablet (325 mg total) by mouth 2 (two) times daily with a meal., Disp: 60 tablet, Rfl: 3 .  Prenatal Vit-Fe Fumarate-FA (PRENATAL MULTIVITAMIN) TABS tablet, Take 1 tablet by mouth daily at 12 noon., Disp: , Rfl:  .  cephALEXin (KEFLEX) 500 MG capsule, Take 1 capsule (500 mg total) by mouth 4 (four) times daily. (Patient not taking: Reported on 05/04/2018), Disp: 28 capsule, Rfl: 0 Social History: Reviewed -  reports that she has quit smoking. Her smoking use included cigarettes. She has never used smokeless tobacco.  Physical Assessment:   Vitals:   05/04/18 1624  BP: 123/70  Pulse: 84  Weight: 227 lb (103 kg)  Body mass index is 40.21  kg/m.        Physical Examination:   General appearance: Well appearing, and in no distress  Mental status: Alert, oriented to person, place, and time  Skin: Warm & dry  Cardiovascular: Normal heart rate noted  Respiratory: Normal respiratory effort, no distress  Abdomen: Soft, gravid, nontender  Pelvic: Pap done, normal appearing, Cx LTC       will run POC GC/CHL  Extremities: Edema: None  Fetal Status:     Movement: Present   US 22+2 wks,breech,cx 3.2 cm,anterior pl gr 0,svp of fluid 5.5 cm,fhr 164 bpm,efw 449 g 20%,anatomy of the spine and NB complete,unable to visualize the [facility]   Results for orders placed or performed in visit on 05/04/18 (from the past 24 hour(s))  POC Urinalysis Dipstick OB   Collection Time: 05/04/18  4:32 PM  Result Value Ref Range   Color, UA     Clarity, UA     Glucose, UA Negative (A) (none)   Bilirubin, UA     Ketones, UA small    Spec Grav, UA  1.010 - 1.025   Blood, UA neg    pH, UA  5.0 - 8.0   POC Protein UA Negative Negative, Trace   Urobilinogen, UA  0.2 or 1.0 E.U./dL   Nitrite, UA neg    Leukocytes, UA Negative Negative   Appearance     Odor  Assessment & Plan:  1) Low-risk pregnancy G1P0 at [redacted]w[redacted]d with an Estimated Date of Delivery: 09/05/18   2) Still unable to see all of heart d/t habitus/fetal position, recheck next visit   Labs/procedures/US today:   Plan:  Continue routine obstetrical care , POC chl off pap   Follow-up: Return in about 1 month (around 06/01/2018) for LROB, US:OB F/U:, PN2.  Orders Placed This Encounter  Procedures  . POC Urinalysis Dipstick OB   Jacklyn Shell [facility] 05/04/2018 4:34 PM

## 2018-05-04 NOTE — Progress Notes (Signed)
US 22+2 wks,breech,cx 3.2 cm,anterior pl gr 0,svp of fluid 5.5 cm,fhr 164 bpm,efw 449 g 20%,anatomy of the spine and NB complete,unable to visualize the [facility]

## 2018-05-06 LAB — CYTOLOGY - PAP
Adequacy: ABSENT
CHLAMYDIA, DNA PROBE: NEGATIVE
Diagnosis: NEGATIVE
NEISSERIA GONORRHEA: NEGATIVE

## 2018-05-11 ENCOUNTER — Encounter: Payer: Self-pay | Admitting: Advanced Practice Midwife

## 2018-05-13 ENCOUNTER — Encounter: Payer: Self-pay | Admitting: Advanced Practice Midwife

## 2018-05-18 ENCOUNTER — Encounter (HOSPITAL_COMMUNITY): Payer: Self-pay | Admitting: Emergency Medicine

## 2018-05-18 ENCOUNTER — Emergency Department (HOSPITAL_COMMUNITY)
Admission: EM | Admit: 2018-05-18 | Discharge: 2018-05-18 | Disposition: A | Discharge status: Home / Self Care | Payer: Medicaid Other | Attending: Emergency Medicine | Admitting: Emergency Medicine

## 2018-05-18 ENCOUNTER — Other Ambulatory Visit: Payer: Self-pay

## 2018-05-18 DIAGNOSIS — Z87891 Personal history of nicotine dependence: Secondary | ICD-10-CM | POA: Insufficient documentation

## 2018-05-18 DIAGNOSIS — S51811A Laceration without foreign body of right forearm, initial encounter: Secondary | ICD-10-CM | POA: Diagnosis not present

## 2018-05-18 DIAGNOSIS — Y92012 Bathroom of single-family (private) house as the place of occurrence of the external cause: Secondary | ICD-10-CM | POA: Insufficient documentation

## 2018-05-18 DIAGNOSIS — Y999 Unspecified external cause status: Secondary | ICD-10-CM | POA: Diagnosis not present

## 2018-05-18 DIAGNOSIS — Z3A24 24 weeks gestation of pregnancy: Secondary | ICD-10-CM | POA: Insufficient documentation

## 2018-05-18 DIAGNOSIS — O9A212 Injury, poisoning and certain other consequences of external causes complicating pregnancy, second trimester: Secondary | ICD-10-CM | POA: Insufficient documentation

## 2018-05-18 DIAGNOSIS — Y93E1 Activity, personal bathing and showering: Secondary | ICD-10-CM | POA: Diagnosis not present

## 2018-05-18 DIAGNOSIS — W25XXXA Contact with sharp glass, initial encounter: Secondary | ICD-10-CM | POA: Diagnosis not present

## 2018-05-18 HISTORY — DX: Encounter for supervision of normal pregnancy, unspecified, unspecified trimester: Z34.90

## 2018-05-18 MED ORDER — LIDOCAINE HCL (PF) 1 % IJ SOLN
5.0000 mL | Freq: Once | INTRAMUSCULAR | Status: AC
  Administered 2018-05-18: 5 mL

## 2018-05-18 MED ORDER — LIDOCAINE HCL (PF) 1 % IJ SOLN
INTRAMUSCULAR | Status: AC
  Administered 2018-05-18: 5 mL
  Filled 2018-05-18: qty 5

## 2018-05-18 MED ORDER — POVIDONE-IODINE 10 % EX SOLN
CUTANEOUS | Status: DC | PRN
  Administered 2018-05-18: 18:00:00 via TOPICAL
  Filled 2018-05-18: qty 15

## 2018-05-18 MED ORDER — LIDOCAINE HCL (PF) 1 % IJ SOLN
INTRAMUSCULAR | Status: AC
  Filled 2018-05-18: qty 5

## 2018-05-18 NOTE — ED Triage Notes (Addendum)
Pt states in shower and pulled hair and slipped cutting right forearm on glass shower door.. PT DID NOT FALL.Bleeding controlled with bandage pt had applied. Approximately 5inches in length.pt is [redacted] weeks pregnant, but denies any complications.

## 2018-05-18 NOTE — ED Provider Notes (Signed)
[facility] EMERGENCY DEPARTMENT Provider Note   CSN: [id number] Arrival date & time: 05/18/18  1646     History   Chief Complaint Chief Complaint  Patient presents with  . Extremity Laceration    HPI Carol Hunt is a 27 y.o. female currently [redacted] weeks pregnant presenting with a laceration to her right forearm. She was showering when she accidentally struck her arm on the shower door when she slipped, breaking the door. She did not fall during this event. She has a large laceration to her forearm which she states bled profusely but is controlled after application of dressing. She denies numbness distal to the injury site.  She is current with her tetanus vaccine. She denies any other complaints.  The history is provided by the patient.    Past Medical History:  Diagnosis Date  . Medical history non-contributory   . Pregnant     Patient Active Problem List   Diagnosis Date Noted  . Chlamydia infection affecting pregnancy in first trimester 02/14/2018  . Supervision of normal first pregnancy 02/09/2018  . Foreign body of forearm, superficial 12/05/2012  . Rectal bleeding 01/21/2011  . Anemia 01/21/2011    Past Surgical History:  Procedure Laterality Date  . MIDDLE EAR SURGERY    . TYMPANOPLASTY       OB History    Gravida  1   Para      Term      Preterm      AB      Living        SAB      TAB      Ectopic      Multiple      Live Births               Home Medications    Prior to Admission medications   Medication Sig Start Date End Date Taking? Authorizing Provider  cephALEXin (KEFLEX) 500 MG capsule Take 1 capsule (500 mg total) by mouth 4 (four) times daily. Patient not taking: Reported on 05/04/2018 04/06/18   Tilda Burrow, MD  ferrous sulfate 325 (65 FE) MG tablet Take 1 tablet (325 mg total) by mouth 2 (two) times daily with a meal. 02/10/18   Cheral Marker, CNM  Prenatal Vit-Fe Fumarate-FA (PRENATAL MULTIVITAMIN) TABS tablet  Take 1 tablet by mouth daily at 12 noon.    [provider]    Family History Family History  Problem Relation Age of Onset  . Pancreatitis Father        living  . Diabetes Sister   . Cancer Maternal Aunt        breast   . Diabetes Paternal Aunt   . COPD Paternal Grandfather   . Colon cancer Neg Hx     Social History Social History   Tobacco Use  . Smoking status: Former Smoker    Types: Cigarettes  . Smokeless tobacco: Never Used  Substance Use Topics  . Alcohol use: No  . Drug use: No     Allergies   Patient has no known allergies.   Review of Systems Review of Systems  Constitutional: Negative.   HENT: Negative.   Respiratory: Negative.   Cardiovascular: Negative.   Gastrointestinal: Negative.   Genitourinary: Negative.   Skin: Positive for wound.  Neurological: Negative for weakness and numbness.     Physical Exam Updated Vital Signs BP 122/66 (BP Location: Left Arm)   Pulse 93   Temp (!) 97.4 F (  36.3 C) (Oral)   Resp 16   Ht 5\' 3"  (1.6 m)   Wt 103 kg   LMP 11/29/2017 (Exact Date)   SpO2 100%   BMI 40.21 kg/m   Physical Exam  Constitutional: She is oriented to person, place, and time. She appears well-developed and well-nourished.  HENT:  Head: Normocephalic.  Cardiovascular: Normal rate.  Pulmonary/Chest: Effort normal.  Musculoskeletal: She exhibits no tenderness.  Neurological: She is alert and oriented to person, place, and time. She has normal strength. No sensory deficit.  Equal grip strength.  Skin: Laceration noted.  12 cm subcutaneous laceration through the right volar forearm, 3 cm depth, linear.  Base of wound clearly visualized with no deep structure visualization and no foreign body.     ED Treatments / Results  Labs (all labs ordered are listed, but only abnormal results are displayed) Labs Reviewed - No data to display  EKG None  Radiology No results found.  Procedures Procedures (including critical  care time)  LACERATION REPAIR Performed by: Burgess AmorIDOL, Mahoganie Basher Authorized by: Burgess AmorIDOL, Lab[place]ford Schnitker Consent: Verbal consent obtained. Risks and benefits: risks, benefits and alternatives were discussed Consent given by: patient Patient identity confirmed: provided demographic data Prepped and Draped in normal sterile fashion Wound explored  Laceration Location: right forearm  Laceration Length: 12 cm  No Foreign Bodies seen or palpated  Anesthesia: local infiltration  Local anesthetic: lidocaine 1% without epinephrine  Anesthetic total: 8 ml  Irrigation method: syringe Amount of cleaning: copious using 400 cc saline after betadine wash  Skin closure: vicryl rapide 4-0 for subcutaneous closure,  ethilon 4-0 for surface stitches  Number of sutures: #6 subc,  # 16 surface  Technique: simple interupted  Patient tolerance: Patient tolerated the procedure well with no immediate complications.   Medications Ordered in ED Medications  lidocaine (PF) (XYLOCAINE) 1 % injection 5 mL (5 mLs Other Given by Other 05/18/18 1745)     Initial Impression / Assessment and Plan / ED Course  I have reviewed the triage vital signs and the nursing notes.  Pertinent labs & imaging results that were available during my care of the patient were reviewed by me and considered in my medical decision making (see chart for details).     Pt with large laceration with clearly visualized base. Tolerated suturing well.  Dressing applied. Wound care instructions given.  Pt advised to have sutures removed in 10 days,  Return here sooner for any signs of infection including redness, swelling, worse pain or drainage of pus.     Final Clinical Impressions(s) / ED Diagnoses   Final diagnoses:  Laceration of right forearm, initial encounter    ED Discharge Orders    None       Victoriano Laindol, Codey Burling, PA-C 05/19/18 1146    Mesner, Barbara CowerJason, MD 05/21/18 1947

## 2018-05-18 NOTE — Discharge Instructions (Addendum)
Have your sutures removed in 10 days.  Keep your wound clean and dry,  Until a good scab forms - you may then wash gently twice daily with mild soap and water, but dry completely after.  Get rechecked for any sign of infection (redness,  Swelling,  Increased pain or drainage of purulent fluid). ° °

## 2018-06-01 ENCOUNTER — Other Ambulatory Visit (HOSPITAL_COMMUNITY): Payer: Self-pay | Admitting: Advanced Practice Midwife

## 2018-06-01 DIAGNOSIS — Z0489 Encounter for examination and observation for other specified reasons: Secondary | ICD-10-CM

## 2018-06-01 DIAGNOSIS — IMO0002 Reserved for concepts with insufficient information to code with codable children: Secondary | ICD-10-CM

## 2018-06-02 ENCOUNTER — Ambulatory Visit (INDEPENDENT_AMBULATORY_CARE_PROVIDER_SITE_OTHER): Payer: Medicaid Other | Admitting: Advanced Practice Midwife

## 2018-06-02 ENCOUNTER — Other Ambulatory Visit: Payer: Self-pay

## 2018-06-02 ENCOUNTER — Ambulatory Visit (INDEPENDENT_AMBULATORY_CARE_PROVIDER_SITE_OTHER): Payer: Medicaid Other

## 2018-06-02 ENCOUNTER — Encounter: Payer: Self-pay | Admitting: Advanced Practice Midwife

## 2018-06-02 ENCOUNTER — Other Ambulatory Visit: Payer: Medicaid Other

## 2018-06-02 VITALS — BP 105/64 | HR 85 | Wt 228.0 lb

## 2018-06-02 DIAGNOSIS — Z3402 Encounter for supervision of normal first pregnancy, second trimester: Secondary | ICD-10-CM

## 2018-06-02 DIAGNOSIS — Z331 Pregnant state, incidental: Secondary | ICD-10-CM

## 2018-06-02 DIAGNOSIS — Z3A26 26 weeks gestation of pregnancy: Secondary | ICD-10-CM

## 2018-06-02 DIAGNOSIS — Z0489 Encounter for examination and observation for other specified reasons: Secondary | ICD-10-CM

## 2018-06-02 DIAGNOSIS — IMO0002 Reserved for concepts with insufficient information to code with codable children: Secondary | ICD-10-CM

## 2018-06-02 DIAGNOSIS — Z1389 Encounter for screening for other disorder: Secondary | ICD-10-CM

## 2018-06-02 DIAGNOSIS — Z8619 Personal history of other infectious and parasitic diseases: Secondary | ICD-10-CM

## 2018-06-02 DIAGNOSIS — Z131 Encounter for screening for diabetes mellitus: Secondary | ICD-10-CM

## 2018-06-02 LAB — POCT URINALYSIS DIPSTICK OB
GLUCOSE, UA: NEGATIVE — AB
Ketones, UA: NEGATIVE
Nitrite, UA: NEGATIVE
POC,PROTEIN,UA: NEGATIVE
RBC UA: NEGATIVE

## 2018-06-02 NOTE — Progress Notes (Signed)
  G1P0 785w3d Estimated Date of Delivery: 09/05/18  Blood pressure 105/64, pulse 85, weight 228 lb (103.4 kg), last menstrual period 11/29/2017.   BP weight and urine results all reviewed and noted.  Please refer to the obstetrical flow sheet for the fundal height and fetal heart rate documentation:  Patient reports good fetal movement, denies any bleeding and no rupture of membranes symptoms or regular contractions. Patient is without complaints. All questions were answered.   Physical Assessment:   Vitals:   06/02/18 1040  BP: 105/64  Pulse: 85  Weight: 228 lb (103.4 kg)  Body mass index is 40.39 kg/m.        Physical Examination:   General appearance: Well appearing, and in no distress  Mental status: Alert, oriented to person, place, and time  Skin: Warm & dry  Cardiovascular: Normal heart rate noted  Respiratory: Normal respiratory effort, no distress  Abdomen: Soft, gravid, nontender  Pelvic: Cervical exam deferred         Extremities: Edema: None  Fetal Status:     Movement: Present   [place] 26+3 wks,cephalic,fhr 157 bpm,anterior pl gr 0,afi 13 cm,cx 2.9 cm,normal ovaries bilat,efw 917 g,anatomy of the heart complete,no obvious abnormalities EFW 33%   Results for orders placed or performed in visit on 06/02/18 (from the past 24 hour(s))  POC Urinalysis Dipstick OB   Collection Time: 06/02/18 10:40 AM  Result Value Ref Range   Color, UA     Clarity, UA     Glucose, UA Negative (A) (none)   Bilirubin, UA     Ketones, UA neg    Spec Grav, UA     Blood, UA neg    pH, UA     POC Protein UA Negative Negative, Trace   Urobilinogen, UA     Nitrite, UA neg    Leukocytes, UA Moderate (2+) (A) Negative   Appearance     Odor       Orders Placed This Encounter  Procedures  . GC/Chlamydia Probe Amp  . POC Urinalysis Dipstick OB    Plan:  Continued routine obstetrical care, POC chl today  Return in about 3 weeks (around 06/23/2018) for LROB.

## 2018-06-02 NOTE — Patient Instructions (Signed)
Carol Hunt, I greatly value your feedback.  If you receive a survey following your visit with us today, we appreciate you taking the time to fill it out.  Thanks, Cathie BeamsFran Cresenzo-Dishmon, CNM   Call the office [phone]) or go to [facility] if:  You begin to have strong, frequent contractions  Your water breaks.  Sometimes it is a big gush of fluid, sometimes it is just a trickle that keeps getting your panties wet or running down your legs  You have vaginal bleeding.  It is normal to have a small amount of spotting if your cervix was checked.   You don't feel your baby moving like normal.  If you don't, get you something to eat and drink and lay down and focus on feeling your baby move.  You should feel at least 10 movements in 2 hours.  If you don't, you should call the office or go to [facility].    Tdap Vaccine  It is recommended that you get the Tdap vaccine during the third trimester of EACH pregnancy to help protect your baby from getting pertussis (whooping cough)  27-36 weeks is the BEST time to do this so that you can pass the protection on to your baby. During pregnancy is better than after pregnancy, but if you are unable to get it during pregnancy it will be offered at the hospital.   You will be offered this vaccine in the office after 27 weeks. If you do not have health insurance, you can get this vaccine at the health department or your family doctor  Everyone who will be around your baby should also be up-to-date on their vaccines. Adults (who are not pregnant) only need 1 dose of Tdap during adulthood.   Third Trimester of Pregnancy The third trimester is from week 29 through week 42, months 7 through 9. The third trimester is a time when the fetus is growing rapidly. At the end of the ninth month, the fetus is about 20 inches in length and weighs 6-10 pounds.  BODY CHANGES Your body goes through many changes during pregnancy. The changes vary from woman to  woman.   Your weight will continue to increase. You can expect to gain 25-35 pounds (11-16 kg) by the end of the pregnancy.  You may begin to get stretch marks on your hips, abdomen, and breasts.  You may urinate more often because the fetus is moving lower into your pelvis and pressing on your bladder.  You may develop or continue to have heartburn as a result of your pregnancy.  You may develop constipation because certain hormones are causing the muscles that push waste through your intestines to slow down.  You may develop hemorrhoids or swollen, bulging veins (varicose veins).  You may have pelvic pain because of the weight gain and pregnancy hormones relaxing your joints between the bones in your pelvis. Backaches may result from overexertion of the muscles supporting your posture.  You may have changes in your hair. These can include thickening of your hair, rapid growth, and changes in texture. Some women also have hair loss during or after pregnancy, or hair that feels dry or thin. Your hair will most likely return to normal after your baby is born.  Your breasts will continue to grow and be tender. A yellow discharge may leak from your breasts called colostrum.  Your belly button may stick out.  You may feel short of breath because of your expanding uterus.  You may notice the fetus "dropping," or moving lower in your abdomen.  You may have a bloody mucus discharge. This usually occurs a few days to a week before labor begins.  Your cervix becomes thin and soft (effaced) near your due date. WHAT TO EXPECT AT YOUR PRENATAL EXAMS  You will have prenatal exams every 2 weeks until week 36. Then, you will have weekly prenatal exams. During a routine prenatal visit:  You will be weighed to make sure you and the fetus are growing normally.  Your blood pressure is taken.  Your abdomen will be measured to track your baby's growth.  The fetal heartbeat will be listened  to.  Any test results from the previous visit will be discussed.  You may have a cervical check near your due date to see if you have effaced. At around 36 weeks, your caregiver will check your cervix. At the same time, your caregiver will also perform a test on the secretions of the vaginal tissue. This test is to determine if a type of bacteria, Group B streptococcus, is present. Your caregiver will explain this further. Your caregiver may ask you:  What your birth plan is.  How you are feeling.  If you are feeling the baby move.  If you have had any abnormal symptoms, such as leaking fluid, bleeding, severe headaches, or abdominal cramping.  If you have any questions. Other tests or screenings that may be performed during your third trimester include:  Blood tests that check for low iron levels (anemia).  Fetal testing to check the health, activity level, and growth of the fetus. Testing is done if you have certain medical conditions or if there are problems during the pregnancy. FALSE LABOR You may feel small, irregular contractions that eventually go away. These are called Braxton Hicks contractions, or false labor. Contractions may last for hours, days, or even weeks before true labor sets in. If contractions come at regular intervals, intensify, or become painful, it is best to be seen by your caregiver.  SIGNS OF LABOR   Menstrual-like cramps.  Contractions that are 5 minutes apart or less.  Contractions that start on the top of the uterus and spread down to the lower abdomen and back.  A sense of increased pelvic pressure or back pain.  A watery or bloody mucus discharge that comes from the vagina. If you have any of these signs before the 37th week of pregnancy, call your caregiver right away. You need to go to the hospital to get checked immediately. HOME CARE INSTRUCTIONS   Avoid all smoking, herbs, alcohol, and unprescribed drugs. These chemicals affect the  formation and growth of the baby.  Follow your caregiver's instructions regarding medicine use. There are medicines that are either safe or unsafe to take during pregnancy.  Exercise only as directed by your caregiver. Experiencing uterine cramps is a good sign to stop exercising.  Continue to eat regular, healthy meals.  Wear a good support bra for breast tenderness.  Do not use hot tubs, steam rooms, or saunas.  Wear your seat belt at all times when driving.  Avoid raw meat, uncooked cheese, cat litter boxes, and soil used by cats. These carry germs that can cause birth defects in the baby.  Take your prenatal vitamins.  Try taking a stool softener (if your caregiver approves) if you develop constipation. Eat more high-fiber foods, such as fresh vegetables or fruit and whole grains. Drink plenty of fluids to keep your urine  clear or pale yellow.  Take warm sitz baths to soothe any pain or discomfort caused by hemorrhoids. Use hemorrhoid cream if your caregiver approves.  If you develop varicose veins, wear support hose. Elevate your feet for 15 minutes, 3-4 times a day. Limit salt in your diet.  Avoid heavy lifting, wear low heal shoes, and practice good posture.  Rest a lot with your legs elevated if you have leg cramps or low back pain.  Visit your dentist if you have not gone during your pregnancy. Use a soft toothbrush to brush your teeth and be gentle when you floss.  A sexual relationship may be continued unless your caregiver directs you otherwise.  Do not travel far distances unless it is absolutely necessary and only with the approval of your caregiver.  Take prenatal classes to understand, practice, and ask questions about the labor and delivery.  Make a trial run to the hospital.  Pack your hospital bag.  Prepare the baby's nursery.  Continue to go to all your prenatal visits as directed by your caregiver. SEEK MEDICAL CARE IF:  You are unsure if you are in  labor or if your water has broken.  You have dizziness.  You have mild pelvic cramps, pelvic pressure, or nagging pain in your abdominal area.  You have persistent nausea, vomiting, or diarrhea.  You have a bad smelling vaginal discharge.  You have pain with urination. SEEK IMMEDIATE MEDICAL CARE IF:   You have a fever.  You are leaking fluid from your vagina.  You have spotting or bleeding from your vagina.  You have severe abdominal cramping or pain.  You have rapid weight loss or gain.  You have shortness of breath with chest pain.  You notice sudden or extreme swelling of your face, hands, ankles, feet, or legs.  You have not felt your baby move in over an hour.  You have severe headaches that do not go away with medicine.  You have vision changes. Document Released: 09/22/2001 Document Revised: 10/03/2013 Document Reviewed: 11/29/2012 [facility] Patient Information 2015 [place], [place]. This information is not intended to replace advice given to you by your health care provider. Make sure you discuss any questions you have with your health care provider.

## 2018-06-02 NOTE — Progress Notes (Signed)
[place] 26+3 wks,cephalic,fhr 157 bpm,anterior pl gr 0,afi 13 cm,cx 2.9 cm,normal ovaries bilat,efw 917 g,anatomy of the heart complete,no obvious abnormalities

## 2018-06-03 LAB — GLUCOSE TOLERANCE, 2 HOURS W/ 1HR
GLUCOSE, 1 HOUR: 166 mg/dL (ref 65–179)
GLUCOSE, FASTING: 94 mg/dL — AB (ref 65–91)
Glucose, 2 hour: 110 mg/dL (ref 65–152)

## 2018-06-03 LAB — CBC
Hematocrit: 31 % — ABNORMAL LOW (ref 34.0–46.6)
Hemoglobin: 10 g/dL — ABNORMAL LOW (ref 11.1–15.9)
MCH: 29.4 pg (ref 26.6–33.0)
MCHC: 32.3 g/dL (ref 31.5–35.7)
MCV: 91 fL (ref 79–97)
Platelets: 311 10*3/uL (ref 150–450)
RBC: 3.4 x10E6/uL — ABNORMAL LOW (ref 3.77–5.28)
RDW: 13.4 % (ref 12.3–15.4)
WBC: 12.1 10*3/uL — ABNORMAL HIGH (ref 3.4–10.8)

## 2018-06-03 LAB — HIV ANTIBODY (ROUTINE TESTING W REFLEX): HIV SCREEN 4TH GENERATION: NONREACTIVE

## 2018-06-03 LAB — RPR: RPR: NONREACTIVE

## 2018-06-03 LAB — ANTIBODY SCREEN: Antibody Screen: NEGATIVE

## 2018-06-04 LAB — GC/CHLAMYDIA PROBE AMP
CHLAMYDIA, DNA PROBE: NEGATIVE
NEISSERIA GONORRHOEAE BY PCR: NEGATIVE

## 2018-06-07 ENCOUNTER — Encounter: Payer: Self-pay | Admitting: Advanced Practice Midwife

## 2018-06-07 ENCOUNTER — Encounter: Payer: Self-pay | Admitting: *Deleted

## 2018-06-07 ENCOUNTER — Other Ambulatory Visit: Payer: Self-pay | Admitting: *Deleted

## 2018-06-07 DIAGNOSIS — O24410 Gestational diabetes mellitus in pregnancy, diet controlled: Secondary | ICD-10-CM

## 2018-06-07 DIAGNOSIS — Z8632 Personal history of gestational diabetes: Secondary | ICD-10-CM | POA: Insufficient documentation

## 2018-06-07 NOTE — Progress Notes (Signed)
Meter, lancets and strips called to [facility].  Patient is to check CBG 4 times daily.

## 2018-06-15 ENCOUNTER — Encounter: Payer: Medicaid Other | Attending: Advanced Practice Midwife | Admitting: Registered"

## 2018-06-15 DIAGNOSIS — O99810 Abnormal glucose complicating pregnancy: Secondary | ICD-10-CM | POA: Insufficient documentation

## 2018-06-15 DIAGNOSIS — Z713 Dietary counseling and surveillance: Secondary | ICD-10-CM | POA: Diagnosis present

## 2018-06-16 ENCOUNTER — Telehealth: Payer: Self-pay | Admitting: *Deleted

## 2018-06-21 ENCOUNTER — Encounter: Payer: Self-pay | Admitting: Registered"

## 2018-06-21 DIAGNOSIS — O99810 Abnormal glucose complicating pregnancy: Secondary | ICD-10-CM | POA: Insufficient documentation

## 2018-06-21 NOTE — Progress Notes (Signed)

## 2018-06-23 ENCOUNTER — Ambulatory Visit (INDEPENDENT_AMBULATORY_CARE_PROVIDER_SITE_OTHER): Payer: Medicaid Other | Admitting: Women's Health

## 2018-06-23 ENCOUNTER — Encounter: Payer: Self-pay | Admitting: Women's Health

## 2018-06-23 VITALS — BP 116/68 | HR 89 | Wt 228.4 lb

## 2018-06-23 DIAGNOSIS — O98811 Other maternal infectious and parasitic diseases complicating pregnancy, first trimester: Secondary | ICD-10-CM

## 2018-06-23 DIAGNOSIS — O24415 Gestational diabetes mellitus in pregnancy, controlled by oral hypoglycemic drugs: Secondary | ICD-10-CM

## 2018-06-23 DIAGNOSIS — O98813 Other maternal infectious and parasitic diseases complicating pregnancy, third trimester: Secondary | ICD-10-CM

## 2018-06-23 DIAGNOSIS — Z23 Encounter for immunization: Secondary | ICD-10-CM | POA: Diagnosis not present

## 2018-06-23 DIAGNOSIS — A749 Chlamydial infection, unspecified: Secondary | ICD-10-CM

## 2018-06-23 DIAGNOSIS — O0990 Supervision of high risk pregnancy, unspecified, unspecified trimester: Secondary | ICD-10-CM

## 2018-06-23 DIAGNOSIS — O24419 Gestational diabetes mellitus in pregnancy, unspecified control: Secondary | ICD-10-CM

## 2018-06-23 DIAGNOSIS — O0993 Supervision of high risk pregnancy, unspecified, third trimester: Secondary | ICD-10-CM

## 2018-06-23 DIAGNOSIS — Z3A29 29 weeks gestation of pregnancy: Secondary | ICD-10-CM | POA: Diagnosis not present

## 2018-06-23 DIAGNOSIS — Z331 Pregnant state, incidental: Secondary | ICD-10-CM

## 2018-06-23 DIAGNOSIS — Z1389 Encounter for screening for other disorder: Secondary | ICD-10-CM

## 2018-06-23 LAB — POCT URINALYSIS DIPSTICK OB
Glucose, UA: NEGATIVE
Leukocytes, UA: NEGATIVE
Nitrite, UA: NEGATIVE
POC,PROTEIN,UA: NEGATIVE
RBC UA: NEGATIVE

## 2018-06-23 MED ORDER — METFORMIN HCL 500 MG PO TABS: 500.0000 mg | ORAL_TABLET | Freq: Every day | ORAL | 3 refills | Status: DC

## 2018-06-23 NOTE — Patient Instructions (Addendum)
Carol Hunt, I greatly value your feedback.  If you receive a survey following your visit with [place] today, we appreciate you taking the time to fill it out.  Thanks, [name], CNM, WHNP-BC   Call the office [phone]) or go to [facility] if:  You begin to have strong, frequent contractions  Your water breaks.  Sometimes it is a big gush of fluid, sometimes it is just a trickle that keeps getting your panties wet or running down your legs  You have vaginal bleeding.  It is normal to have a small amount of spotting if your cervix was checked.   You don't feel your baby moving like normal.  If you don't, get you something to eat and drink and lay down and focus on feeling your baby move.  You should feel at least 10 movements in 2 hours.  If you don't, you should call the office or go to [facility].    Tdap Vaccine  It is recommended that you get the Tdap vaccine during the third trimester of EACH pregnancy to help protect your baby from getting pertussis (whooping cough)  27-36 weeks is the BEST time to do this so that you can pass the protection on to your baby. During pregnancy is better than after pregnancy, but if you are unable to get it during pregnancy it will be offered at the hospital.   You can get this vaccine with [place], at the health department, your family doctor, or some local pharmacies  Everyone who will be around your baby should also be up-to-date on their vaccines before the baby comes. Adults (who are not pregnant) only need 1 dose of Tdap during adulthood.   Third Trimester of Pregnancy The third trimester is from week 29 through week 42, months 7 through 9. The third trimester is a time when the fetus is growing rapidly. At the end of the ninth month, the fetus is about 20 inches in length and weighs 6-10 pounds.  BODY CHANGES Your body goes through many changes during pregnancy. The changes vary from woman to woman.   Your weight will continue to  increase. You can expect to gain 25-35 pounds (11-16 kg) by the end of the pregnancy.  You may begin to get stretch marks on your hips, abdomen, and breasts.  You may urinate more often because the fetus is moving lower into your pelvis and pressing on your bladder.  You may develop or continue to have heartburn as a result of your pregnancy.  You may develop constipation because certain hormones are causing the muscles that push waste through your intestines to slow down.  You may develop hemorrhoids or swollen, bulging veins (varicose veins).  You may have pelvic pain because of the weight gain and pregnancy hormones relaxing your joints between the bones in your pelvis. Backaches may result from overexertion of the muscles supporting your posture.  You may have changes in your hair. These can include thickening of your hair, rapid growth, and changes in texture. Some women also have hair loss during or after pregnancy, or hair that feels dry or thin. Your hair will most likely return to normal after your baby is born.  Your breasts will continue to grow and be tender. A yellow discharge may leak from your breasts called colostrum.  Your belly button may stick out.  You may feel short of breath because of your expanding uterus.  You may notice the fetus "dropping," or moving lower  in your abdomen.  You may have a bloody mucus discharge. This usually occurs a few days to a week before labor begins.  Your cervix becomes thin and soft (effaced) near your due date. WHAT TO EXPECT AT YOUR PRENATAL EXAMS  You will have prenatal exams every 2 weeks until week 36. Then, you will have weekly prenatal exams. During a routine prenatal visit:  You will be weighed to make sure you and the fetus are growing normally.  Your blood pressure is taken.  Your abdomen will be measured to track your baby's growth.  The fetal heartbeat will be listened to.  Any test results from the previous visit  will be discussed.  You may have a cervical check near your due date to see if you have effaced. At around 36 weeks, your caregiver will check your cervix. At the same time, your caregiver will also perform a test on the secretions of the vaginal tissue. This test is to determine if a type of bacteria, Group B streptococcus, is present. Your caregiver will explain this further. Your caregiver may ask you:  What your birth plan is.  How you are feeling.  If you are feeling the baby move.  If you have had any abnormal symptoms, such as leaking fluid, bleeding, severe headaches, or abdominal cramping.  If you have any questions. Other tests or screenings that may be performed during your third trimester include:  Blood tests that check for low iron levels (anemia).  Fetal testing to check the health, activity level, and growth of the fetus. Testing is done if you have certain medical conditions or if there are problems during the pregnancy. FALSE LABOR You may feel small, irregular contractions that eventually go away. These are called Braxton Hicks contractions, or false labor. Contractions may last for hours, days, or even weeks before true labor sets in. If contractions come at regular intervals, intensify, or become painful, it is best to be seen by your caregiver.  SIGNS OF LABOR   Menstrual-like cramps.  Contractions that are 5 minutes apart or less.  Contractions that start on the top of the uterus and spread down to the lower abdomen and back.  A sense of increased pelvic pressure or back pain.  A watery or bloody mucus discharge that comes from the vagina. If you have any of these signs before the 37th week of pregnancy, call your caregiver right away. You need to go to the hospital to get checked immediately. HOME CARE INSTRUCTIONS   Avoid all smoking, herbs, alcohol, and unprescribed drugs. These chemicals affect the formation and growth of the baby.  Follow your  caregiver's instructions regarding medicine use. There are medicines that are either safe or unsafe to take during pregnancy.  Exercise only as directed by your caregiver. Experiencing uterine cramps is a good sign to stop exercising.  Continue to eat regular, healthy meals.  Wear a good support bra for breast tenderness.  Do not use hot tubs, steam rooms, or saunas.  Wear your seat belt at all times when driving.  Avoid raw meat, uncooked cheese, cat litter boxes, and soil used by cats. These carry germs that can cause birth defects in the baby.  Take your prenatal vitamins.  Try taking a stool softener (if your caregiver approves) if you develop constipation. Eat more high-fiber foods, such as fresh vegetables or fruit and whole grains. Drink plenty of fluids to keep your urine clear or pale yellow.  Take warm sitz baths   to soothe any pain or discomfort caused by hemorrhoids. Use hemorrhoid cream if your caregiver approves.  If you develop varicose veins, wear support hose. Elevate your feet for 15 minutes, 3-4 times a day. Limit salt in your diet.  Avoid heavy lifting, wear low heal shoes, and practice good posture.  Rest a lot with your legs elevated if you have leg cramps or low back pain.  Visit your dentist if you have not gone during your pregnancy. Use a soft toothbrush to brush your teeth and be gentle when you floss.  A sexual relationship may be continued unless your caregiver directs you otherwise.  Do not travel far distances unless it is absolutely necessary and only with the approval of your caregiver.  Take prenatal classes to understand, practice, and ask questions about the labor and delivery.  Make a trial run to the hospital.  Pack your hospital bag.  Prepare the baby's nursery.  Continue to go to all your prenatal visits as directed by your caregiver. SEEK MEDICAL CARE IF:  You are unsure if you are in labor or if your water has broken.  You have  dizziness.  You have mild pelvic cramps, pelvic pressure, or nagging pain in your abdominal area.  You have persistent nausea, vomiting, or diarrhea.  You have a bad smelling vaginal discharge.  You have pain with urination. SEEK IMMEDIATE MEDICAL CARE IF:   You have a fever.  You are leaking fluid from your vagina.  You have spotting or bleeding from your vagina.  You have severe abdominal cramping or pain.  You have rapid weight loss or gain.  You have shortness of breath with chest pain.  You notice sudden or extreme swelling of your face, hands, ankles, feet, or legs.  You have not felt your baby move in over an hour.  You have severe headaches that do not go away with medicine.  You have vision changes. Document Released: 09/22/2001 Document Revised: 10/03/2013 Document Reviewed: 11/29/2012 [facility] Patient Information 2015 [place]. This information is not intended to replace advice given to you by your health care provider. Make sure you discuss any questions you have with your health care provider.  PROTECT YOURSELF & YOUR BABY FROM THE FLU! Because you are pregnant, we at [facility], along with the Centers for Disease Control (CDC), recommend that you receive the flu vaccine to protect yourself and your baby from the flu. The flu is more likely to cause severe illness in pregnant women than in women of reproductive age who are not pregnant. Changes in the immune system, heart, and lungs during pregnancy make pregnant women (and women up to two weeks postpartum) more prone to severe illness from flu, including illness resulting in hospitalization. Flu also may be harmful for a pregnant woman's developing baby. A common flu symptom is fever, which may be associated with neural tube defects and other adverse outcomes for a developing baby. Getting vaccinated can also help protect a baby after birth from flu. (Mom passes antibodies onto the developing baby during her  pregnancy.)  A Flu Vaccine is the Best Protection Against Flu Getting a flu vaccine is the first and most important step in protecting against flu. Pregnant women should get a flu shot and not the live attenuated influenza vaccine (LAIV), also known as nasal spray flu vaccine. Flu vaccines given during pregnancy help protect both the mother and her baby from flu. Vaccination has been shown to reduce the risk of flu-associated acute  respiratory infection in pregnant women by up to one-half. A 2018 study showed that getting a flu shot reduced a pregnant woman's risk of being hospitalized with flu by an average of 40 percent. Pregnant women who get a flu vaccine are also helping to protect their babies from flu illness for the first several months after their birth, when they are too young to get vaccinated.   A Long Record of Safety for Flu Shots in Pregnant Women Flu shots have been given to millions of pregnant women over many years with a good safety record. There is a lot of evidence that flu vaccines can be given safely during pregnancy; though these data are limited for the first trimester. The CDC recommends that pregnant women get vaccinated during any trimester of their pregnancy. It is very important for pregnant women to get the flu shot.   Other Preventive Actions In addition to getting a flu shot, pregnant women should take the same everyday preventive actions the CDC recommends of everyone, including covering coughs, washing hands often, and avoiding people who are sick.  Symptoms and Treatment If you get sick with flu symptoms call your doctor right away. There are antiviral drugs that can treat flu illness and prevent serious flu complications. The CDC recommends prompt treatment for people who have influenza infection or suspected influenza infection and who are at high risk of serious flu complications, such as people with asthma, diabetes (including gestational diabetes), or heart  disease. Early treatment of influenza in hospitalized pregnant women has been shown to reduce the length of the hospital stay.  Symptoms Flu symptoms include fever, cough, sore throat, runny or stuffy nose, body aches, headache, chills and fatigue. Some people may also have vomiting and diarrhea. People may be infected with the flu and have respiratory symptoms without a fever.  Early Treatment is Important for Pregnant Women Treatment should begin as soon as possible because antiviral drugs work best when started early (within 48 hours after symptoms start). Antiviral drugs can make your flu illness milder and make you feel better faster. They may also prevent serious health problems that can result from flu illness. Oral oseltamivir (Tamiflu) is the preferred treatment for pregnant women because it has the most studies available to suggest that it is safe and beneficial. Antiviral drugs require a prescription from your provider. Having a fever caused by flu infection or other infections early in pregnancy may be linked to birth defects in a baby. In addition to taking antiviral drugs, pregnant women who get a fever should treat their fever with Tylenol (acetaminophen) and contact their provider immediately.  When to Seek Emergency Medical Care If you are pregnant and have any of these signs, seek care immediately:  Difficulty breathing or shortness of breath  Pain or pressure in the chest or abdomen  Sudden dizziness  Confusion  Severe or persistent vomiting  High fever that is not responding to Tylenol (or store brand equivalent)  Decreased or no movement of your baby  [email or web address]

## 2018-06-23 NOTE — Progress Notes (Signed)
HIGH-RISK PREGNANCY VISIT Patient name: Carol Hunt MRN [id number]  Date of birth: August 15, 1991 Chief Complaint:   High Risk Gestation (Tdap)  History of Present Illness:   Carol Hunt is a 27 y.o. G1P0 female at 6683w3d with an Estimated Date of Delivery: 09/05/18 being seen today for ongoing management of a high-risk pregnancy complicated by A2DM- meds added today.  Today she reports FBS 100-121, 2hr pp 81-156 (majority <120). Contractions: Not present.  .  Movement: Present. denies leaking of fluid.  Review of Systems:   Pertinent items are noted in HPI Denies abnormal vaginal discharge w/ itching/odor/irritation, headaches, visual changes, shortness of breath, chest pain, abdominal pain, severe nausea/vomiting, or problems with urination or bowel movements unless otherwise stated above. Pertinent History Reviewed:  Reviewed past medical,surgical, social, obstetrical and family history.  Reviewed problem list, medications and allergies. Physical Assessment:   Vitals:   06/23/18 0858  BP: 116/68  Pulse: 89  Weight: 228 lb 6.4 oz (103.6 kg)  Body mass index is 40.46 kg/m.           Physical Examination:   General appearance: alert, well appearing, and in no distress  Mental status: alert, oriented to person, place, and time  Skin: warm & dry   Extremities: Edema: None    Cardiovascular: normal heart rate noted  Respiratory: normal respiratory effort, no distress  Abdomen: gravid, soft, non-tender  Pelvic: Cervical exam deferred         Fetal Status: Fetal Heart Rate (bpm): 154 Fundal Height: 29 cm Movement: Present    Fetal Surveillance Testing today: doppler   Results for orders placed or performed in visit on 06/23/18 (from the past 24 hour(s))  POC Urinalysis Dipstick OB   Collection Time: 06/23/18  9:00 AM  Result Value Ref Range   Color, UA     Clarity, UA     Glucose, UA Negative Negative   Bilirubin, UA     Ketones, UA small    Spec Grav, UA     Blood, UA  neg    pH, UA     POC Protein UA Negative Negative, Trace   Urobilinogen, UA     Nitrite, UA neg    Leukocytes, UA Negative Negative   Appearance     Odor      Assessment & Plan:  1) High-risk pregnancy G1P0 at 3083w3d with an Estimated Date of Delivery: 09/05/18   2) A2DM, unstable, rx metformin 500mg  w/ supper  Meds:  Meds ordered this encounter  Medications  . metFORMIN (GLUCOPHAGE) 500 MG tablet    Sig: Take 1 tablet (500 mg total) by mouth daily with supper.    Dispense:  30 tablet    Refill:  3    Order Specific Question:   Supervising Provider    Answer:   Duane LopeEURE, LUTHER H [[id number]]    Labs/procedures today: tdap. Declined flu shot. She was advised that the flu shot is recommended during pregnancy to help protect her and her baby. We discussed that it is an inactivated vaccine-so side effects are minimal, it is considered safe to receive during any trimester, and pregnant women are at a higher risk of developing potential complications from the flu, including death. She was given printed information from the CDC regarding the flu shot and the flu.    Treatment Plan:  Growth u/s @ 20, 36-38wks     2x/wk testing @ 32wks or weekly BPP    Deliver @ 39wks  Reviewed:  Preterm labor symptoms and general obstetric precautions including but not limited to vaginal bleeding, contractions, leaking of fluid and fetal movement were reviewed in detail with the patient.  All questions were answered.  Follow-up: Return in about 1 week (around 06/30/2018) for HROB.  Orders Placed This Encounter  Procedures  . Tdap vaccine greater than or equal to 7yo IM  . POC Urinalysis Dipstick OB   Cheral Marker CNM, [facility] 06/23/2018 9:29 AM

## 2018-06-30 ENCOUNTER — Ambulatory Visit (INDEPENDENT_AMBULATORY_CARE_PROVIDER_SITE_OTHER): Payer: Medicaid Other | Admitting: Advanced Practice Midwife

## 2018-06-30 VITALS — BP 113/68 | HR 97 | Wt 230.0 lb

## 2018-06-30 DIAGNOSIS — Z1389 Encounter for screening for other disorder: Secondary | ICD-10-CM

## 2018-06-30 DIAGNOSIS — O24410 Gestational diabetes mellitus in pregnancy, diet controlled: Secondary | ICD-10-CM

## 2018-06-30 DIAGNOSIS — Z331 Pregnant state, incidental: Secondary | ICD-10-CM

## 2018-06-30 DIAGNOSIS — Z3A30 30 weeks gestation of pregnancy: Secondary | ICD-10-CM

## 2018-06-30 DIAGNOSIS — O0993 Supervision of high risk pregnancy, unspecified, third trimester: Secondary | ICD-10-CM

## 2018-06-30 LAB — POCT URINALYSIS DIPSTICK OB
Blood, UA: NEGATIVE
GLUCOSE, UA: NEGATIVE
KETONES UA: NEGATIVE
Nitrite, UA: NEGATIVE
POC,PROTEIN,UA: NEGATIVE

## 2018-06-30 MED ORDER — CEPHALEXIN 500 MG PO CAPS: 500.0000 mg | ORAL_CAPSULE | Freq: Four times a day (QID) | ORAL | 0 refills | Status: DC

## 2018-06-30 NOTE — Progress Notes (Signed)
HIGH-RISK PREGNANCY VISIT Patient name: Carol Hunt MRN [id number]  Date of birth: 10/22/1990 Chief Complaint:   Routine Prenatal Visit  History of Present Illness:   Carol Hunt is a 27 y.o. G1P0 female at 6648w3d with an Estimated Date of Delivery: 09/05/18 being seen today for ongoing management of a high-risk pregnancy complicated by gestational DM, class  A2 DM.  Today she reports all FBS >95-110, all pp normal except a few 120's. Contractions: Not present.  .  Movement: Present. denies leaking of fluid.  Review of Systems:   Pertinent items are noted in HPI Denies abnormal vaginal discharge w/ itching/odor/irritation, headaches, visual changes, shortness of breath, chest pain, abdominal pain, severe nausea/vomiting, or problems with urination or bowel movements unless otherwise stated above.    Pertinent History Reviewed:  Medical & Surgical Hx:   Past Medical History:  Diagnosis Date  . Medical history non-contributory   . Pregnant    Past Surgical History:  Procedure Laterality Date  . MIDDLE EAR SURGERY    . TYMPANOPLASTY     Family History  Problem Relation Age of Onset  . Pancreatitis Father        living  . Diabetes Sister   . Cancer Maternal Aunt        breast   . Diabetes Paternal Aunt   . COPD Paternal Grandfather   . Colon cancer Neg Hx     Current Outpatient Medications:  .  ferrous sulfate 325 (65 FE) MG tablet, Take 1 tablet (325 mg total) by mouth 2 (two) times daily with a meal., Disp: 60 tablet, Rfl: 3 .  metFORMIN (GLUCOPHAGE) 500 MG tablet, Take 1 tablet (500 mg total) by mouth daily with supper., Disp: 30 tablet, Rfl: 3 .  Prenatal Vit-Fe Fumarate-FA (PRENATAL MULTIVITAMIN) TABS tablet, Take 1 tablet by mouth daily at 12 noon., Disp: , Rfl:  .  cephALEXin (KEFLEX) 500 MG capsule, Take 1 capsule (500 mg total) by mouth 4 (four) times daily., Disp: 28 capsule, Rfl: 0 Social History: Reviewed -  reports that she has quit smoking. Her smoking use  included cigarettes. She has never used smokeless tobacco.   Physical Assessment:   Vitals:   06/30/18 1600  BP: 113/68  Pulse: 97  Weight: 104.3 kg  Body mass index is 40.74 kg/m.           Physical Examination:   General appearance: alert, well appearing, and in no distress  Mental status: alert, oriented to person, place, and time  Skin: warm & dry   Extremities: Edema: Trace    Cardiovascular: normal heart rate noted  Respiratory: normal respiratory effort, no distress  Abdomen: gravid, soft, non-tender  Pelvic: Cervical exam deferred         Fetal Status: Fetal Heart Rate (bpm): 153 Fundal Height: 30 cm Movement: Present    Fetal Surveillance Testing today: doppler  Results for orders placed or performed in visit on 06/30/18 (from the past 24 hour(s))  POC Urinalysis Dipstick OB   Collection Time: 06/30/18  4:22 PM  Result Value Ref Range   Color, UA     Clarity, UA     Glucose, UA Negative Negative   Bilirubin, UA     Ketones, UA neg    Spec Grav, UA     Blood, UA neg    pH, UA     POC Protein UA Negative Negative, Trace   Urobilinogen, UA     Nitrite, UA neg  Leukocytes, UA Trace (A) Negative   Appearance     Odor      Assessment & Plan:  1) High-risk pregnancy G1P0 at [redacted]w[redacted]d with an Estimated Date of Delivery: 09/05/18   2) A2Dm, unstable increase metformin to 1000mg  qhs 3) ,    Treatment Plan:  Weekly BPPs at 32 weeks, IOL 39 weeks  Follow-up: Return for sta      rt weekly BPP/HROB in 2 weeks.  Orders Placed This Encounter  Procedures  . US FETAL BPP WO NON STRESS  . POC Urinalysis Dipstick OB   Jacklyn Shell CNM 06/30/2018 9:07 PM

## 2018-07-13 ENCOUNTER — Other Ambulatory Visit: Payer: Self-pay | Admitting: Advanced Practice Midwife

## 2018-07-13 DIAGNOSIS — O24410 Gestational diabetes mellitus in pregnancy, diet controlled: Secondary | ICD-10-CM

## 2018-07-13 MED ORDER — METFORMIN HCL 1000 MG PO TABS: 1000.0000 mg | ORAL_TABLET | Freq: Every day | ORAL | 3 refills | Status: DC

## 2018-07-14 ENCOUNTER — Ambulatory Visit (INDEPENDENT_AMBULATORY_CARE_PROVIDER_SITE_OTHER): Payer: Medicaid Other

## 2018-07-14 ENCOUNTER — Ambulatory Visit (INDEPENDENT_AMBULATORY_CARE_PROVIDER_SITE_OTHER): Payer: Medicaid Other | Admitting: Obstetrics & Gynecology

## 2018-07-14 ENCOUNTER — Encounter: Payer: Self-pay | Admitting: Obstetrics & Gynecology

## 2018-07-14 VITALS — BP 119/70 | HR 96 | Wt 227.5 lb

## 2018-07-14 DIAGNOSIS — O24419 Gestational diabetes mellitus in pregnancy, unspecified control: Secondary | ICD-10-CM

## 2018-07-14 DIAGNOSIS — Z3A32 32 weeks gestation of pregnancy: Secondary | ICD-10-CM

## 2018-07-14 DIAGNOSIS — O24410 Gestational diabetes mellitus in pregnancy, diet controlled: Secondary | ICD-10-CM

## 2018-07-14 DIAGNOSIS — Z331 Pregnant state, incidental: Secondary | ICD-10-CM

## 2018-07-14 DIAGNOSIS — Z1389 Encounter for screening for other disorder: Secondary | ICD-10-CM

## 2018-07-14 DIAGNOSIS — O0993 Supervision of high risk pregnancy, unspecified, third trimester: Secondary | ICD-10-CM

## 2018-07-14 DIAGNOSIS — O0990 Supervision of high risk pregnancy, unspecified, unspecified trimester: Secondary | ICD-10-CM

## 2018-07-14 LAB — POCT URINALYSIS DIPSTICK OB
GLUCOSE, UA: NEGATIVE
Nitrite, UA: NEGATIVE
POC,PROTEIN,UA: NEGATIVE
RBC UA: NEGATIVE

## 2018-07-14 NOTE — Progress Notes (Signed)
[place] 32+3 wks,cephalic,anterior placenta gr 3,bilat adnexa's wnl,BPP 8/8,AFI 11 cm,fhr 171 bpm

## 2018-07-14 NOTE — Progress Notes (Signed)
Patient ID: Carol Hunt, female   DOB: 1991-01-13, 27 y.o.   MRN: [id number]   [facility] PREGNANCY VISIT Patient name: Carol Hunt MRN [id number]  Date of birth: Feb 29, 1991 Chief Complaint:   High Risk Gestation ([place] today)  History of Present Illness:   Carol Hunt is a 27 y.o. G1P0 female at [redacted]w[redacted]d with an Estimated Date of Delivery: 09/05/18 being seen today for ongoing management of a high-risk pregnancy complicated by class  A2 DM.  Today she reports no complaints. Contractions: Irregular. Vag. Bleeding: None.  Movement: Present. denies leaking of fluid.  Review of Systems:   Pertinent items are noted in HPI Denies abnormal vaginal discharge w/ itching/odor/irritation, headaches, visual changes, shortness of breath, chest pain, abdominal pain, severe nausea/vomiting, or problems with urination or bowel movements unless otherwise stated above. Pertinent History Reviewed:  Reviewed past medical,surgical, social, obstetrical and family history.  Reviewed problem list, medications and allergies. Physical Assessment:   Vitals:   07/14/18 1616  BP: 119/70  Pulse: 96  Weight: 227 lb 8 oz (103.2 kg)  Body mass index is 40.3 kg/m.           Physical Examination:   General appearance: alert, well appearing, and in no distress  Mental status: alert, oriented to person, place, and time, normal mood, behavior, speech, dress, motor activity, and thought processes  Skin: warm & dry   Extremities: Edema: Trace    Cardiovascular: normal heart rate noted  Respiratory: normal respiratory effort, no distress  Abdomen: gravid, soft, non-tender  Pelvic: Cervical exam deferred         Fetal Status:     Movement: Present    Fetal Surveillance Testing today: BPP 8/8   Results for orders placed or performed in visit on 07/14/18 (from the past 24 hour(s))  POC Urinalysis Dipstick OB   Collection Time: 07/14/18  4:18 PM  Result Value Ref Range   Color, UA     Clarity, UA     Glucose, UA  Negative Negative   Bilirubin, UA     Ketones, UA 3+    Spec Grav, UA     Blood, UA neg    pH, UA     POC Protein UA Negative Negative, Trace   Urobilinogen, UA     Nitrite, UA neg    Leukocytes, UA Moderate (2+) (A) Negative   Appearance     Odor      Assessment & Plan:  1) High-risk pregnancy G1P0 at [redacted]w[redacted]d with an Estimated Date of Delivery: 09/05/18   2) Class A2 DM, unstable, poorly controlled am fasting, 2 hour pp are good Try to change to bedtime dosing and see if that helps if not will need pm NPH   Meds: No orders of the defined types were placed in this encounter.   Labs/procedures today:   Treatment Plan:  Weekly BPP  Reviewed: Preterm labor symptoms and general obstetric precautions including but not limited to vaginal bleeding, contractions, leaking of fluid and fetal movement were reviewed in detail with the patient.  All questions were answered.  Follow-up: No follow-ups on file.  Orders Placed This Encounter  Procedures  . POC Urinalysis Dipstick OB   Lazaro Arms  07/14/2018 4:27 PM

## 2018-07-21 ENCOUNTER — Encounter: Payer: Self-pay | Admitting: Advanced Practice Midwife

## 2018-07-21 ENCOUNTER — Ambulatory Visit (INDEPENDENT_AMBULATORY_CARE_PROVIDER_SITE_OTHER): Payer: Medicaid Other | Admitting: Advanced Practice Midwife

## 2018-07-21 ENCOUNTER — Ambulatory Visit (INDEPENDENT_AMBULATORY_CARE_PROVIDER_SITE_OTHER): Payer: Medicaid Other

## 2018-07-21 VITALS — BP 113/67 | HR 83 | Wt 228.4 lb

## 2018-07-21 DIAGNOSIS — Z331 Pregnant state, incidental: Secondary | ICD-10-CM

## 2018-07-21 DIAGNOSIS — Z3A33 33 weeks gestation of pregnancy: Secondary | ICD-10-CM

## 2018-07-21 DIAGNOSIS — O0993 Supervision of high risk pregnancy, unspecified, third trimester: Secondary | ICD-10-CM

## 2018-07-21 DIAGNOSIS — O24419 Gestational diabetes mellitus in pregnancy, unspecified control: Secondary | ICD-10-CM

## 2018-07-21 DIAGNOSIS — O0990 Supervision of high risk pregnancy, unspecified, unspecified trimester: Secondary | ICD-10-CM

## 2018-07-21 DIAGNOSIS — Z1389 Encounter for screening for other disorder: Secondary | ICD-10-CM

## 2018-07-21 LAB — POCT URINALYSIS DIPSTICK OB
GLUCOSE, UA: NEGATIVE
Leukocytes, UA: NEGATIVE
Nitrite, UA: NEGATIVE
POC,PROTEIN,UA: NEGATIVE
RBC UA: NEGATIVE

## 2018-07-21 MED ORDER — GLYBURIDE 2.5 MG PO TABS: 2.5000 mg | ORAL_TABLET | Freq: Every day | ORAL | 3 refills | Status: DC

## 2018-07-21 NOTE — Progress Notes (Signed)
[place] 33+3 wks,cephalic,BPP 8/8,FHR 177 bpm,anterior pl gr 3,normal ovaries bilat,afi 14 cm

## 2018-07-21 NOTE — Progress Notes (Signed)
HIGH-RISK PREGNANCY VISIT Patient name: Carol Hunt MRN [id number]  Date of birth: 1991-08-07 Chief Complaint:   High Risk Gestation (bpp)  History of Present Illness:   Carol Hunt is a 27 y.o. G1P0 female at [redacted]w[redacted]d with an Estimated Date of Delivery: 09/05/18 being seen today for ongoing management of a high-risk pregnancy complicated by gestational DM, class  A2 DM.  Today she reports no complaints. Contractions: Not present.  .  Movement: Present. denies leaking of fluid.  Review of Systems:   Pertinent items are noted in HPI Denies abnormal vaginal discharge w/ itching/odor/irritation, headaches, visual changes, shortness of breath, chest pain, abdominal pain, severe nausea/vomiting, or problems with urination or bowel movements unless otherwise stated above.    Pertinent History Reviewed:  Medical & Surgical Hx:   Past Medical History:  Diagnosis Date  . Medical history non-contributory   . Pregnant    Past Surgical History:  Procedure Laterality Date  . MIDDLE EAR SURGERY    . TYMPANOPLASTY     Family History  Problem Relation Age of Onset  . Pancreatitis Father        living  . Diabetes Sister   . Cancer Maternal Aunt        breast   . Diabetes Paternal Aunt   . COPD Paternal Grandfather   . Colon cancer Neg Hx     Current Outpatient Medications:  .  cephALEXin (KEFLEX) 500 MG capsule, Take 1 capsule (500 mg total) by mouth 4 (four) times daily., Disp: 28 capsule, Rfl: 0 .  ferrous sulfate 325 (65 FE) MG tablet, Take 1 tablet (325 mg total) by mouth 2 (two) times daily with a meal., Disp: 60 tablet, Rfl: 3 .  metFORMIN (GLUCOPHAGE) 1000 MG tablet, Take 1 tablet (1,000 mg total) by mouth daily with supper., Disp: 30 tablet, Rfl: 3 .  Prenatal Vit-Fe Fumarate-FA (PRENATAL MULTIVITAMIN) TABS tablet, Take 1 tablet by mouth daily at 12 noon., Disp: , Rfl:  .  glyBURIDE (DIABETA) 2.5 MG tablet, Take 1 tablet (2.5 mg total) by mouth at bedtime., Disp: 30 tablet, Rfl:  3 Social History: Reviewed -  reports that she has quit smoking. Her smoking use included cigarettes. She has never used smokeless tobacco.   Physical Assessment:   Vitals:   07/21/18 1044  BP: 113/67  Pulse: 83  Weight: 228 lb 6.4 oz (103.6 kg)  Body mass index is 40.46 kg/m.           Physical Examination:   General appearance: alert, well appearing, and in no distress  Mental status: alert, oriented to person, place, and time  Skin: warm & dry   Extremities: Edema: None    Cardiovascular: normal heart rate noted  Respiratory: normal respiratory effort, no distress  Abdomen: gravid, soft, non-tender  Pelvic: Cervical exam deferred         Fetal Status:     Movement: Present Presentation: Vertex  Fetal Surveillance Testing today: [place] 33+3 wks,cephalic,BPP 8/8,FHR 177 bpm,anterior pl gr 3,normal ovaries bilat,afi 14 cm  Results for orders placed or performed in visit on 07/21/18 (from the past 24 hour(s))  POC Urinalysis Dipstick OB   Collection Time: 07/21/18 10:21 AM  Result Value Ref Range   Color, UA     Clarity, UA     Glucose, UA Negative Negative   Bilirubin, UA     Ketones, UA trace    Spec Grav, UA     Blood, UA neg    pH,  UA     POC Protein UA Negative Negative, Trace   Urobilinogen, UA     Nitrite, UA neg    Leukocytes, UA Negative Negative   Appearance     Odor      Assessment & Plan:  1) High-risk pregnancy G1P0 at [redacted]w[redacted]d with an Estimated Date of Delivery: 09/05/18   2) A2DM, unstable. All fastings still high (99-106)  But better since taking Metformin later at night.  Will add glyburide 2.5mg  hs   Treatment Plan:  QID BS testing; weekly BPP, EFW 36-38 IOL 39 weeks     Orders Placed This Encounter  Procedures  . POC Urinalysis Dipstick OB   Jacklyn Shell CNM 07/21/2018 11:51 AM

## 2018-07-28 ENCOUNTER — Ambulatory Visit (INDEPENDENT_AMBULATORY_CARE_PROVIDER_SITE_OTHER): Payer: Medicaid Other

## 2018-07-28 ENCOUNTER — Encounter: Payer: Self-pay | Admitting: Women's Health

## 2018-07-28 ENCOUNTER — Ambulatory Visit (INDEPENDENT_AMBULATORY_CARE_PROVIDER_SITE_OTHER): Payer: Medicaid Other | Admitting: Women's Health

## 2018-07-28 VITALS — BP 115/69 | HR 87 | Wt 228.5 lb

## 2018-07-28 DIAGNOSIS — O24410 Gestational diabetes mellitus in pregnancy, diet controlled: Secondary | ICD-10-CM | POA: Diagnosis not present

## 2018-07-28 DIAGNOSIS — Z331 Pregnant state, incidental: Secondary | ICD-10-CM

## 2018-07-28 DIAGNOSIS — Z3A34 34 weeks gestation of pregnancy: Secondary | ICD-10-CM

## 2018-07-28 DIAGNOSIS — O24419 Gestational diabetes mellitus in pregnancy, unspecified control: Secondary | ICD-10-CM

## 2018-07-28 DIAGNOSIS — O24415 Gestational diabetes mellitus in pregnancy, controlled by oral hypoglycemic drugs: Secondary | ICD-10-CM

## 2018-07-28 DIAGNOSIS — O0990 Supervision of high risk pregnancy, unspecified, unspecified trimester: Secondary | ICD-10-CM

## 2018-07-28 DIAGNOSIS — O0993 Supervision of high risk pregnancy, unspecified, third trimester: Secondary | ICD-10-CM

## 2018-07-28 DIAGNOSIS — Z1389 Encounter for screening for other disorder: Secondary | ICD-10-CM

## 2018-07-28 LAB — POCT URINALYSIS DIPSTICK OB
Glucose, UA: NEGATIVE
Ketones, UA: NEGATIVE
LEUKOCYTES UA: NEGATIVE
NITRITE UA: NEGATIVE
RBC UA: NEGATIVE

## 2018-07-28 NOTE — Progress Notes (Signed)
[place] 34+3 wks,cephalic,BPP 8/8,anterior pl gr 3,normal ovaries bilat,afi 13 cm,fhr 171 bpm

## 2018-07-28 NOTE — Progress Notes (Signed)
HIGH-RISK PREGNANCY VISIT Patient name: Carol Hunt MRN [id number]  Date of birth: 1991-09-03 Chief Complaint:   High Risk Gestation (ultrasound)  History of Present Illness:   Carol Hunt is a 27 y.o. G1P0 female at [redacted]w[redacted]d with an Estimated Date of Delivery: 09/05/18 being seen today for ongoing management of a high-risk pregnancy complicated by A2DM on metformin 1,000mg  w/ supper, glyburide 2.5mg  qhs.  Today she reports all sugars wnl!. Contractions: Not present.  .  Movement: Present. denies leaking of fluid.  Review of Systems:   Pertinent items are noted in HPI Denies abnormal vaginal discharge w/ itching/odor/irritation, headaches, visual changes, shortness of breath, chest pain, abdominal pain, severe nausea/vomiting, or problems with urination or bowel movements unless otherwise stated above. Pertinent History Reviewed:  Reviewed past medical,surgical, social, obstetrical and family history.  Reviewed problem list, medications and allergies. Physical Assessment:   Vitals:   07/28/18 1628  BP: 115/69  Pulse: 87  Weight: 228 lb 8 oz (103.6 kg)  Body mass index is 40.48 kg/m.           Physical Examination:   General appearance: alert, well appearing, and in no distress  Mental status: alert, oriented to person, place, and time  Skin: warm & dry   Extremities: Edema: Trace    Cardiovascular: normal heart rate noted  Respiratory: normal respiratory effort, no distress  Abdomen: gravid, soft, non-tender  Pelvic: Cervical exam deferred         Fetal Status: Fetal Heart Rate (bpm): 171 u/s Fundal Height: 34 cm Movement: Present Presentation: Vertex  Fetal Surveillance Testing today: [place] 34+3 wks,cephalic,BPP 8/8,anterior pl gr 3,normal ovaries bilat,afi 13 cm,fhr 171 bpm  Results for orders placed or performed in visit on 07/28/18 (from the past 24 hour(s))  POC Urinalysis Dipstick OB   Collection Time: 07/28/18  4:27 PM  Result Value Ref Range   Color, UA     Clarity, UA     Glucose, UA Negative Negative   Bilirubin, UA     Ketones, UA neg    Spec Grav, UA     Blood, UA neg    pH, UA     POC Protein UA Trace Negative, Trace   Urobilinogen, UA     Nitrite, UA neg    Leukocytes, UA Negative Negative   Appearance     Odor      Assessment & Plan:  1) High-risk pregnancy G1P0 at [redacted]w[redacted]d with an Estimated Date of Delivery: 09/05/18   2) A2DM, stable, continue metformin 1,000mg  supper, glyburide 2.5mg  hs  Meds: No orders of the defined types were placed in this encounter.  Labs/procedures today: Declined flu shot. She was advised that the flu shot is recommended during pregnancy to help protect her and her baby. We discussed that it is an inactivated vaccine-so side effects are minimal, it is considered safe to receive during any trimester, and pregnant women are at a higher risk of developing potential complications from the flu, including death. She was given printed information from the CDC regarding the flu shot and the flu.    Treatment Plan:  Growth u/s @ 36-38wks    weekly BPP    Deliver @ 39wks   Reviewed: Preterm labor symptoms and general obstetric precautions including but not limited to vaginal bleeding, contractions, leaking of fluid and fetal movement were reviewed in detail with the patient.  All questions were answered.  Follow-up: Return for As scheduled 1wk for bpp and hrob.  Orders  Placed This Encounter  Procedures  . US FETAL BPP WO NON STRESS  . POC Urinalysis Dipstick OB   Cheral Marker CNM, [facility] 07/28/2018 4:56 PM

## 2018-07-28 NOTE — Patient Instructions (Addendum)
[name], I greatly value your feedback.  If you receive a survey following your visit with [place] today, we appreciate you taking the time to fill it out.  Thanks, [name], CNM, WHNP-BC   Call the office [phone]) or go to [facility] if:  You begin to have strong, frequent contractions  Your water breaks.  Sometimes it is a big gush of fluid, sometimes it is just a trickle that keeps getting your panties wet or running down your legs  You have vaginal bleeding.  It is normal to have a small amount of spotting if your cervix was checked.   You don't feel your baby moving like normal.  If you don't, get you something to eat and drink and lay down and focus on feeling your baby move.  You should feel at least 10 movements in 2 hours.  If you don't, you should call the office or go to [facility].     Preterm Labor and Birth Information The normal length of a pregnancy is 39-41 weeks. Preterm labor is when labor starts before 37 completed weeks of pregnancy. What are the risk factors for preterm labor? Preterm labor is more likely to occur in women who:  Have certain infections during pregnancy such as a bladder infection, sexually transmitted infection, or infection inside the uterus (chorioamnionitis).  Have a shorter-than-normal cervix.  Have gone into preterm labor before.  Have had surgery on their cervix.  Are younger than age 77 or older than age 53.  Are African American.  Are pregnant with twins or multiple babies (multiple gestation).  Take street drugs or smoke while pregnant.  Do not gain enough weight while pregnant.  Became pregnant shortly after having been pregnant.  What are the symptoms of preterm labor? Symptoms of preterm labor include:  Cramps similar to those that can happen during a menstrual period. The cramps may happen with diarrhea.  Pain in the abdomen or lower back.  Regular uterine contractions that may feel like tightening  of the abdomen.  A feeling of increased pressure in the pelvis.  Increased watery or bloody mucus discharge from the vagina.  Water breaking (ruptured amniotic sac).  Why is it important to recognize signs of preterm labor? It is important to recognize signs of preterm labor because babies who are born prematurely may not be fully developed. This can put them at an increased risk for:  Long-term (chronic) heart and lung problems.  Difficulty immediately after birth with regulating body systems, including blood sugar, body temperature, heart rate, and breathing rate.  Bleeding in the brain.  Cerebral palsy.  Learning difficulties.  Death.  These risks are highest for babies who are born before 34 weeks of pregnancy. How is preterm labor treated? Treatment depends on the length of your pregnancy, your condition, and the health of your baby. It may involve:  Having a stitch (suture) placed in your cervix to prevent your cervix from opening too early (cerclage).  Taking or being given medicines, such as: ? Hormone medicines. These may be given early in pregnancy to help support the pregnancy. ? Medicine to stop contractions. ? Medicines to help mature the baby's lungs. These may be prescribed if the risk of delivery is high. ? Medicines to prevent your baby from developing cerebral palsy.  If the labor happens before 34 weeks of pregnancy, you may need to stay in the hospital. What should I do if I think I am in preterm labor? If  you think that you are going into preterm labor, call your health care provider right away. How can I prevent preterm labor in future pregnancies? To increase your chance of having a full-term pregnancy:  Do not use any tobacco products, such as cigarettes, chewing tobacco, and e-cigarettes. If you need help quitting, ask your health care provider.  Do not use street drugs or medicines that have not been prescribed to you during your pregnancy.  Talk  with your health care provider before taking any herbal supplements, even if you have been taking them regularly.  Make sure you gain a healthy amount of weight during your pregnancy.  Watch for infection. If you think that you might have an infection, get it checked right away.  Make sure to tell your health care provider if you have gone into preterm labor before.  This information is not intended to replace advice given to you by your health care provider. Make sure you discuss any questions you have with your health care provider. Document Released: 12/19/2003 Document Revised: 03/10/2016 Document Reviewed: 02/19/2016 Elsevier Interactive Patient Education  2018 [employer]  PROTECT YOURSELF & YOUR BABY FROM THE FLU! Because you are pregnant, we at [facility], along with the Centers for Disease Control (CDC), recommend that you receive the flu vaccine to protect yourself and your baby from the flu. The flu is more likely to cause severe illness in pregnant women than in women of reproductive age who are not pregnant. Changes in the immune system, heart, and lungs during pregnancy make pregnant women (and women up to two weeks postpartum) more prone to severe illness from flu, including illness resulting in hospitalization. Flu also may be harmful for a pregnant woman's developing baby. A common flu symptom is fever, which may be associated with neural tube defects and other adverse outcomes for a developing baby. Getting vaccinated can also help protect a baby after birth from flu. (Mom passes antibodies onto the developing baby during her pregnancy.)  A Flu Vaccine is the Best Protection Against Flu Getting a flu vaccine is the first and most important step in protecting against flu. Pregnant women should get a flu shot and not the live attenuated influenza vaccine (LAIV), also known as nasal spray flu vaccine. Flu vaccines given during pregnancy help protect both the mother and her baby  from flu. Vaccination has been shown to reduce the risk of flu-associated acute respiratory infection in pregnant women by up to one-half. A 2018 study showed that getting a flu shot reduced a pregnant woman's risk of being hospitalized with flu by an average of 40 percent. Pregnant women who get a flu vaccine are also helping to protect their babies from flu illness for the first several months after their birth, when they are too young to get vaccinated.   A Long Record of Safety for Flu Shots in Pregnant Women Flu shots have been given to millions of pregnant women over many years with a good safety record. There is a lot of evidence that flu vaccines can be given safely during pregnancy; though these data are limited for the first trimester. The CDC recommends that pregnant women get vaccinated during any trimester of their pregnancy. It is very important for pregnant women to get the flu shot.   Other Preventive Actions In addition to getting a flu shot, pregnant women should take the same everyday preventive actions the CDC recommends of everyone, including covering coughs, washing hands often, and avoiding people who  are sick.  Symptoms and Treatment If you get sick with flu symptoms call your doctor right away. There are antiviral drugs that can treat flu illness and prevent serious flu complications. The CDC recommends prompt treatment for people who have influenza infection or suspected influenza infection and who are at high risk of serious flu complications, such as people with asthma, diabetes (including gestational diabetes), or heart disease. Early treatment of influenza in hospitalized pregnant women has been shown to reduce the length of the hospital stay.  Symptoms Flu symptoms include fever, cough, sore throat, runny or stuffy nose, body aches, headache, chills and fatigue. Some people may also have vomiting and diarrhea. People may be infected with the flu and have respiratory  symptoms without a fever.  Early Treatment is Important for Pregnant Women Treatment should begin as soon as possible because antiviral drugs work best when started early (within 48 hours after symptoms start). Antiviral drugs can make your flu illness milder and make you feel better faster. They may also prevent serious health problems that can result from flu illness. Oral oseltamivir (Tamiflu) is the preferred treatment for pregnant women because it has the most studies available to suggest that it is safe and beneficial. Antiviral drugs require a prescription from your provider. Having a fever caused by flu infection or other infections early in pregnancy may be linked to birth defects in a baby. In addition to taking antiviral drugs, pregnant women who get a fever should treat their fever with Tylenol (acetaminophen) and contact their provider immediately.  When to Seek Emergency Medical Care If you are pregnant and have any of these signs, seek care immediately:  Difficulty breathing or shortness of breath  Pain or pressure in the chest or abdomen  Sudden dizziness  Confusion  Severe or persistent vomiting  High fever that is not responding to Tylenol (or store brand equivalent)  Decreased or no movement of your baby  [email or web address]

## 2018-08-04 ENCOUNTER — Ambulatory Visit (INDEPENDENT_AMBULATORY_CARE_PROVIDER_SITE_OTHER): Payer: Medicaid Other

## 2018-08-04 ENCOUNTER — Ambulatory Visit (INDEPENDENT_AMBULATORY_CARE_PROVIDER_SITE_OTHER): Payer: Medicaid Other | Admitting: Obstetrics and Gynecology

## 2018-08-04 VITALS — BP 139/85 | HR 87 | Wt 229.2 lb

## 2018-08-04 DIAGNOSIS — O24415 Gestational diabetes mellitus in pregnancy, controlled by oral hypoglycemic drugs: Secondary | ICD-10-CM

## 2018-08-04 DIAGNOSIS — O24419 Gestational diabetes mellitus in pregnancy, unspecified control: Secondary | ICD-10-CM

## 2018-08-04 DIAGNOSIS — Z1389 Encounter for screening for other disorder: Secondary | ICD-10-CM

## 2018-08-04 DIAGNOSIS — O0993 Supervision of high risk pregnancy, unspecified, third trimester: Secondary | ICD-10-CM

## 2018-08-04 DIAGNOSIS — Z331 Pregnant state, incidental: Secondary | ICD-10-CM

## 2018-08-04 DIAGNOSIS — Z3A35 35 weeks gestation of pregnancy: Secondary | ICD-10-CM

## 2018-08-04 DIAGNOSIS — O0990 Supervision of high risk pregnancy, unspecified, unspecified trimester: Secondary | ICD-10-CM

## 2018-08-04 LAB — POCT URINALYSIS DIPSTICK OB
GLUCOSE, UA: NEGATIVE
Leukocytes, UA: NEGATIVE
Nitrite, UA: NEGATIVE
POC,PROTEIN,UA: NEGATIVE
RBC UA: NEGATIVE

## 2018-08-04 NOTE — Progress Notes (Signed)
Patient ID: [name], female   DOB: 06-21-1991, 27 y.o.   MRN: [id number]    [facility] PREGNANCY VISIT Patient name: Carol Hunt MRN [id number]  Date of birth: [birth year] Chief Complaint:   High Risk Gestation (bpp)  History of Present Illness:   Carol Hunt is a 27 y.o. G1P0 female at [redacted]w[redacted]d with an Estimated Date of Delivery: 09/05/18 being seen today for ongoing management of a high-risk pregnancy complicated by A2DM on metformin 1,000mg  w/ supper, glyburide 2.5mg  qhs. Cbg's EXCELLENT since adding the glyburide Today she reports no complaints. Blood sugars are very good. Fastings all below 91. She has only gained 4 lbs throughout the whole pregnancy.   Contractions: Not present.  .  Movement: Present. denies leaking of fluid.  Review of Systems:   Pertinent items are noted in HPI Denies abnormal vaginal discharge w/ itching/odor/irritation, headaches, visual changes, shortness of breath, chest pain, abdominal pain, severe nausea/vomiting, or problems with urination or bowel movements unless otherwise stated above. Pertinent History Reviewed:  Reviewed past medical,surgical, social, obstetrical and family history.  Reviewed problem list, medications and allergies. Physical Assessment:   Vitals:   08/04/18 1117  BP: 139/85  Pulse: 87  Weight: 229 lb 3.2 oz (104 kg)  Body mass index is 40.6 kg/m.           Physical Examination:   General appearance: alert, well appearing, and in no distress and oriented to person, place, and time  Mental status: alert, oriented to person, place, and time, normal mood, behavior, speech, dress, motor activity, and thought processes, affect appropriate to mood  Skin: warm & dry   Extremities: Edema: Trace    Cardiovascular: normal heart rate noted  Respiratory: normal respiratory effort, no distress  Abdomen: gravid, soft, non-tender  Pelvic: Cervical exam deferred         Fetal Status:     Movement: Present    Fetal Surveillance  Testing today: BPP  Results for orders placed or performed in visit on 08/04/18 (from the past 24 hour(s))  POC Urinalysis Dipstick OB   Collection Time: 08/04/18 11:21 AM  Result Value Ref Range   Color, UA     Clarity, UA     Glucose, UA Negative Negative   Bilirubin, UA     Ketones, UA small    Spec Grav, UA     Blood, UA neg    pH, UA     POC Protein UA Negative Negative, Trace   Urobilinogen, UA     Nitrite, UA neg    Leukocytes, UA Negative Negative   Appearance     Odor      Assessment & Plan:  1) High-risk pregnancy G1P0 at [redacted]w[redacted]d with an Estimated Date of Delivery: 09/05/18   2) A2DM, stable on metformin 1,000mg  w/ supper, glyburide 2.5mg  qhs. Cbc's have been excellent throughout during the day, just high fasting cbg's. So all the meds are taken HS. Meds: No orders of the defined types were placed in this encounter.  Labs/procedures today: BPP [place] 35+3 wks,cephalic,BPP 8/8,FHR 159 bpm,anterior placenta gr 3,afi 10 cm  Treatment Plan:  Growth u/s @ 36-38wks    weekly BPP    Deliver @ 39wks   Reviewed: Preterm labor symptoms and general obstetric precautions including but not limited to vaginal bleeding, contractions, leaking of fluid and fetal movement were reviewed in detail with the patient.  All questions were answered.  Follow-up: Return in about 1 week (around 08/11/2018) for HROB, U/S:  BPP.  Orders Placed This Encounter  Procedures  . POC Urinalysis Dipstick OB   By signing my name below, I, Pietro Cassis, attest that this documentation has been prepared under the direction and in the presence of Tilda Burrow, MD. Electronically Signed: Pietro Cassis, Medical Scribe. 08/04/18. 11:39 AM.  I personally performed the services described in this documentation, which was SCRIBED in my presence. The recorded information has been reviewed and considered accurate. It has been edited as necessary during review. Tilda Burrow, MD

## 2018-08-04 NOTE — Progress Notes (Signed)
[place] 35+3 wks,cephalic,BPP 8/8,FHR 159 bpm,anterior placenta gr 3,afi 10 cm

## 2018-08-11 ENCOUNTER — Other Ambulatory Visit: Payer: Self-pay | Admitting: Obstetrics and Gynecology

## 2018-08-11 DIAGNOSIS — O24419 Gestational diabetes mellitus in pregnancy, unspecified control: Secondary | ICD-10-CM

## 2018-08-12 ENCOUNTER — Encounter: Payer: Self-pay | Admitting: Women's Health

## 2018-08-12 ENCOUNTER — Ambulatory Visit (INDEPENDENT_AMBULATORY_CARE_PROVIDER_SITE_OTHER): Payer: Medicaid Other

## 2018-08-12 ENCOUNTER — Other Ambulatory Visit: Payer: Self-pay

## 2018-08-12 ENCOUNTER — Ambulatory Visit (INDEPENDENT_AMBULATORY_CARE_PROVIDER_SITE_OTHER): Payer: Medicaid Other | Admitting: Women's Health

## 2018-08-12 VITALS — BP 130/70 | HR 90 | Wt 229.0 lb

## 2018-08-12 DIAGNOSIS — O24410 Gestational diabetes mellitus in pregnancy, diet controlled: Secondary | ICD-10-CM | POA: Diagnosis not present

## 2018-08-12 DIAGNOSIS — Z331 Pregnant state, incidental: Secondary | ICD-10-CM

## 2018-08-12 DIAGNOSIS — O24419 Gestational diabetes mellitus in pregnancy, unspecified control: Secondary | ICD-10-CM | POA: Diagnosis not present

## 2018-08-12 DIAGNOSIS — Z3A36 36 weeks gestation of pregnancy: Secondary | ICD-10-CM

## 2018-08-12 DIAGNOSIS — O24415 Gestational diabetes mellitus in pregnancy, controlled by oral hypoglycemic drugs: Secondary | ICD-10-CM

## 2018-08-12 DIAGNOSIS — Z1389 Encounter for screening for other disorder: Secondary | ICD-10-CM

## 2018-08-12 DIAGNOSIS — O0993 Supervision of high risk pregnancy, unspecified, third trimester: Secondary | ICD-10-CM

## 2018-08-12 LAB — POCT URINALYSIS DIPSTICK OB
GLUCOSE, UA: NEGATIVE
KETONES UA: NEGATIVE
Nitrite, UA: NEGATIVE
POC,PROTEIN,UA: NEGATIVE
RBC UA: NEGATIVE

## 2018-08-12 LAB — OB RESULTS CONSOLE GBS: GBS: NEGATIVE

## 2018-08-12 NOTE — Progress Notes (Signed)
   HIGH-RISK PREGNANCY VISIT Patient name: Carol Hunt MRN [id number]  Date of birth: August 02, 1991 Chief Complaint:   High Risk Gestation (u/s today; gbs/gc)  History of Present Illness:   Carol Hunt is a 27 y.o. G1P0 female at [redacted]w[redacted]d with an Estimated Date of Delivery: 09/05/18 being seen today for ongoing management of a high-risk pregnancy complicated by A2DM on glyburide 2.5mg  hs, metformin 1,000mg  pm Today she reports all sugars normal. Contractions: Irregular. Vag. Bleeding: None.  Movement: Present. denies leaking of fluid.  Review of Systems:   Pertinent items are noted in HPI Denies abnormal vaginal discharge w/ itching/odor/irritation, headaches, visual changes, shortness of breath, chest pain, abdominal pain, severe nausea/vomiting, or problems with urination or bowel movements unless otherwise stated above. Pertinent History Reviewed:  Reviewed past medical,surgical, social, obstetrical and family history.  Reviewed problem list, medications and allergies. Physical Assessment:   Vitals:   08/12/18 1058  BP: 130/70  Pulse: 90  Weight: 229 lb (103.9 kg)  Body mass index is 40.57 kg/m.           Physical Examination:   General appearance: alert, well appearing, and in no distress  Mental status: alert, oriented to person, place, and time  Skin: warm & dry   Extremities: Edema: Trace    Cardiovascular: normal heart rate noted  Respiratory: normal respiratory effort, no distress  Abdomen: gravid, soft, non-tender  Pelvic: Cervical exam performed  Dilation: Fingertip Effacement (%): 80 Station: -2  Fetal Status: Fetal Heart Rate (bpm): 152 u/s Fundal Height: 36 cm Movement: Present Presentation: Vertex  Fetal Surveillance Testing today: [place] 36+4 wks,cephalic,BPP 8/8,anterior placenta gr 3,afi 10 cm,normal ovaries bilat,fhr 152 bpm,EFW 2976 g 54%  Results for orders placed or performed in visit on 08/12/18 (from the past 24 hour(s))  POC Urinalysis Dipstick OB   Collection Time: 08/12/18 10:58 AM  Result Value Ref Range   Color, UA     Clarity, UA     Glucose, UA Negative Negative   Bilirubin, UA     Ketones, UA neg    Spec Grav, UA     Blood, UA neg    pH, UA     POC Protein UA Negative Negative, Trace   Urobilinogen, UA     Nitrite, UA neg    Leukocytes, UA Small (1+) (A) Negative   Appearance     Odor      Assessment & Plan:  1) High-risk pregnancy G1P0 at [redacted]w[redacted]d with an Estimated Date of Delivery: 09/05/18   2) A2DM, stable, EFW 54%, continue glyburide 2.5mg  hs, metformin 1,000mg  qhs   Meds: No orders of the defined types were placed in this encounter.  Labs/procedures today: gbs, gc/ct, sve, bpp/efw u/s  Treatment Plan:  Weekly bpp, IOL @ 39wks  Reviewed: Preterm labor symptoms and general obstetric precautions including but not limited to vaginal bleeding, contractions, leaking of fluid and fetal movement were reviewed in detail with the patient.  All questions were answered.  Follow-up: Return for As scheduled 1wk for hrob/bpp.  Orders Placed This Encounter  Procedures  . GC/Chlamydia Probe Amp  . Culture, beta strep (group b only)  . US FETAL BPP WO NON STRESS  . POC Urinalysis Dipstick OB   Cheral Marker CNM, [facility] 08/12/2018 12:22 PM

## 2018-08-12 NOTE — Progress Notes (Signed)
[place] 36+4 wks,cephalic,BPP 8/8,anterior placenta gr 3,afi 10 cm,normal ovaries bilat,fhr 152 bpm,EFW 2976 g 54%

## 2018-08-12 NOTE — Patient Instructions (Signed)
Carol Hunt, I greatly value your feedback.  If you receive a survey following your visit with [place] today, we appreciate you taking the time to fill it out.  Thanks, [name], CNM, WHNP-BC   Call the office [phone]) or go to [facility] if:  You begin to have strong, frequent contractions  Your water breaks.  Sometimes it is a big gush of fluid, sometimes it is just a trickle that keeps getting your panties wet or running down your legs  You have vaginal bleeding.  It is normal to have a small amount of spotting if your cervix was checked.   You don't feel your baby moving like normal.  If you don't, get you something to eat and drink and lay down and focus on feeling your baby move.  You should feel at least 10 movements in 2 hours.  If you don't, you should call the office or go to [facility].     [facility] Contractions Contractions of the uterus can occur throughout pregnancy, but they are not always a sign that you are in labor. You may have practice contractions called Braxton Hicks contractions. These false labor contractions are sometimes confused with true labor. What are Deberah Pelton contractions? Braxton Hicks contractions are tightening movements that occur in the muscles of the uterus before labor. Unlike true labor contractions, these contractions do not result in opening (dilation) and thinning of the cervix. Toward the end of pregnancy (32-34 weeks), Braxton Hicks contractions can happen more often and may become stronger. These contractions are sometimes difficult to tell apart from true labor because they can be very uncomfortable. You should not feel embarrassed if you go to the hospital with false labor. Sometimes, the only way to tell if you are in true labor is for your health care provider to look for changes in the cervix. The health care provider will do a physical exam and may monitor your contractions. If you are not in true labor, the exam should  show that your cervix is not dilating and your water has not broken. If there are other health problems associated with your pregnancy, it is completely safe for you to be sent home with false labor. You may continue to have Braxton Hicks contractions until you go into true labor. How to tell the difference between true labor and false labor True labor  Contractions last 30-70 seconds.  Contractions become very regular.  Discomfort is usually felt in the top of the uterus, and it spreads to the lower abdomen and low back.  Contractions do not go away with walking.  Contractions usually become more intense and increase in frequency.  The cervix dilates and gets thinner. False labor  Contractions are usually shorter and not as strong as true labor contractions.  Contractions are usually irregular.  Contractions are often felt in the front of the lower abdomen and in the groin.  Contractions may go away when you walk around or change positions while lying down.  Contractions get weaker and are shorter-lasting as time goes on.  The cervix usually does not dilate or become thin. Follow these instructions at home:  Take over-the-counter and prescription medicines only as told by your health care provider.  Keep up with your usual exercises and follow other instructions from your health care provider.  Eat and drink lightly if you think you are going into labor.  If Braxton Hicks contractions are making you uncomfortable: ? Change your position from lying  down or resting to walking, or change from walking to resting. ? Sit and rest in a tub of warm water. ? Drink enough fluid to keep your urine pale yellow. Dehydration may cause these contractions. ? Do slow and deep breathing several times an hour.  Keep all follow-up prenatal visits as told by your health care provider. This is important. Contact a health care provider if:  You have a fever.  You have continuous pain in  your abdomen. Get help right away if:  Your contractions become stronger, more regular, and closer together.  You have fluid leaking or gushing from your vagina.  You pass blood-tinged mucus (bloody show).  You have bleeding from your vagina.  You have low back pain that you never had before.  You feel your baby's head pushing down and causing pelvic pressure.  Your baby is not moving inside you as much as it used to. Summary  Contractions that occur before labor are called Braxton Hicks contractions, false labor, or practice contractions.  Braxton Hicks contractions are usually shorter, weaker, farther apart, and less regular than true labor contractions. True labor contractions usually become progressively stronger and regular and they become more frequent.  Manage discomfort from [facility] contractions by changing position, resting in a warm bath, drinking plenty of water, or practicing deep breathing. This information is not intended to replace advice given to you by your health care provider. Make sure you discuss any questions you have with your health care provider. Document Released: 02/11/2017 Document Revised: 02/11/2017 Document Reviewed: 02/11/2017 Elsevier Interactive Patient Education  2018 [employer].

## 2018-08-14 LAB — GC/CHLAMYDIA PROBE AMP
Chlamydia trachomatis, NAA: NEGATIVE
Neisseria gonorrhoeae by PCR: NEGATIVE

## 2018-08-16 LAB — CULTURE, BETA STREP (GROUP B ONLY): STREP GP B CULTURE: NEGATIVE

## 2018-08-18 ENCOUNTER — Ambulatory Visit (INDEPENDENT_AMBULATORY_CARE_PROVIDER_SITE_OTHER): Payer: Medicaid Other | Admitting: Obstetrics and Gynecology

## 2018-08-18 ENCOUNTER — Telehealth (HOSPITAL_COMMUNITY): Payer: Self-pay | Admitting: *Deleted

## 2018-08-18 ENCOUNTER — Ambulatory Visit (INDEPENDENT_AMBULATORY_CARE_PROVIDER_SITE_OTHER): Payer: Medicaid Other

## 2018-08-18 ENCOUNTER — Encounter (HOSPITAL_COMMUNITY): Payer: Self-pay | Admitting: *Deleted

## 2018-08-18 VITALS — BP 130/81 | HR 73 | Wt 231.2 lb

## 2018-08-18 DIAGNOSIS — O24419 Gestational diabetes mellitus in pregnancy, unspecified control: Secondary | ICD-10-CM | POA: Diagnosis not present

## 2018-08-18 DIAGNOSIS — Z3A37 37 weeks gestation of pregnancy: Secondary | ICD-10-CM

## 2018-08-18 DIAGNOSIS — O24415 Gestational diabetes mellitus in pregnancy, controlled by oral hypoglycemic drugs: Secondary | ICD-10-CM

## 2018-08-18 DIAGNOSIS — Z331 Pregnant state, incidental: Secondary | ICD-10-CM

## 2018-08-18 DIAGNOSIS — Z1389 Encounter for screening for other disorder: Secondary | ICD-10-CM

## 2018-08-18 DIAGNOSIS — O0990 Supervision of high risk pregnancy, unspecified, unspecified trimester: Secondary | ICD-10-CM

## 2018-08-18 DIAGNOSIS — O0993 Supervision of high risk pregnancy, unspecified, third trimester: Secondary | ICD-10-CM | POA: Diagnosis not present

## 2018-08-18 LAB — POCT URINALYSIS DIPSTICK OB
Glucose, UA: NEGATIVE
KETONES UA: NEGATIVE
Leukocytes, UA: NEGATIVE
Nitrite, UA: NEGATIVE
POC,PROTEIN,UA: NEGATIVE
RBC UA: NEGATIVE

## 2018-08-18 NOTE — Telephone Encounter (Signed)
Preadmission screen  

## 2018-08-18 NOTE — Patient Instructions (Signed)
CHILDBIRTH CLASSES ° °[facility] °Call to Register: [phone] or [phone] or Register Online: [email or web address] ° °THESE CLASSES FILL UP VERY QUICKLY, SO SIGN UP AS SOON AS YOU CAN!!! ° °*Please visit [facility] pregnancy website at [email or web address]* ° °Option 1: Birth & Baby Series °• This series of 3 weekly classes helps you and your labor partner prepare for childbirth at [facility]. °• Reviews newborn care, labor & birth, pain management, and comfort techniques °• Maternity Care Center Tour of [facility] is included. °• Cost: $60 per couple for insured or self-pay, $30 per couple for Medicaid ° °Option 2: Weekend Birth & Baby °• This class is a weekend version of our Birth & Baby series. It is designed for parents who have a difficult time fitting several weeks of classes into their schedule.  °• Maternity Care Center Tour of [facility] is included.  °• Friday 6:30pm-8:30pm, Saturday 9am-4pm °• Cost: $75 per couple for insured or self-pay, $30 per couple for Medicaid ° °Option 3: Natural Childbirth °• This series of 5 weekly classes is for expectant parents who want to learn and practice natural methods of coping with the process of labor and childbirth. °• Maternity Care Center Tour of [facility] is included. °• Cost: $75 per couple for insured or self-pay, $30 per couple for Medicaid ° °Option 4: Online Birth & Baby °• This online class offers you the freedom to complete a Birth & Baby series in the comfort of your own home. The flexibility of this option allows you to review sections at your own place, at times convenient to you and your support people.  °• Cost: $60 for 60 days of online access ° ° ° °Other Available Classes °Baby & Me °Enjoy this time to discuss newborn & infant parenting topics and family adjustment issues with other new mothers in a relaxed environment. Each week brings a new speaker or baby-centered activity. We encourage  mothers and their babies (birth to crawling) to join us every Thursday in the [facility] at 11:00 am. You are welcome to visit this group even if you haven't delivered yet! It's wonderful to make new friends early and watch other moms interact with their babies. No registration or fee.  ° °Big Brother/ Big Sister °Let your children share in the joy of a new brother or sister in this special class designed just for them. This class is designed for children ages 2-6, but any age is welcome. Please register each child individually. ° °Breastfeeding Support Group °This group is a mother-to-mother support circle where moms have the opportunity to share their breastfeeding experiences. A breastfeeding Support nurse is present for questions and concerns. Meets each Tuesday at 11:00 am. No fee or registration. ° °Breastfeeding Your Baby °Breastfeeding is a special time for mother and child. This class will help you feel ready to begin this important relationship. Your partner is encouraged to attend with you.  ° °Caring For Baby °This class is for expectant  and adoptive parents who want to learn and practice the most up-to-date newborn care for their babies. Register only the mom-to-be and your partner can come with you. (Note: This class is included in the Birth & Baby series and the Weekend Birth & Baby classes.) ° °Comfort Techniques & Tour °This 2-hour interactive class will provide you the opportunity to learn & practice hands-on techniques with your partner that can help relieve some discomfort of labor and encourage your baby to rotate toward   the best position for birth. A tour of the [facility] is included.  ° °Daddy Boot Camp °This course offers Dads-to-be the tools and knowledge needed to feel confident on their journey to becoming new fathers. ° °Grandparent Love °Expecting a grandbaby? Learn about the latest infant care and safety recommendation and ways to  support your own child as he or she transitions into the parenting role.  ° °Infant and Child CPR °Parents, grandparents, babysitters, and friends learn Cardio-Pulmonary Resuscitation skills for infants and children. Register each participant individually. (Note: This Family & Friends program does not offer certification.) ° °Marvelous Multiples °Expecting twins, triplets, or more? This class covers the differences in labor, birth, parenting, and breastfeeding issues that face multiples' parents. NICU tour is included.  ° °Mom Talk °This mom-led group offers support and connection to mothers as they journey through the adjustments and struggles of that sometimes overwhelming first year after the birth of a child. A member of our [facility] staff will be present to share resources and additional support if needed, as you care for yourself and baby. You are welcome to visit the group before you deliver! It's wonderful to meet new friends early and watch other moms interact with their babies. It's held at [facility] at 10:00 am each Tuesday morning and 6:00 pm each Thursday evening. Babies (birth to crawling) welcome. No registration or fee.  ° °Waterbirth Classes °Interested in a waterbirth? This informational class will help you discover whether waterbirth is the right fir for you.  ° °[facility] Virtual Maternity Tour °View a virtual tour of [facility]. In-person tours are available for participants of childbirth education classes.  ° °

## 2018-08-18 NOTE — Addendum Note (Signed)
Addended by: Tilda Burrow on: 08/18/2018 11:06 AM   Modules accepted: Orders, SmartSet

## 2018-08-18 NOTE — Progress Notes (Signed)
Patient ID: [name], female   DOB: July 11, 1991, 27 y.o.   MRN: [id number]    [facility] PREGNANCY VISIT Patient name: Carol Hunt MRN [id number]  Date of birth: 1991/05/24 Chief Complaint:   Routine Prenatal Visit ([place] today)  History of Present Illness:   Carol Hunt is a 27 y.o. G1P0 female at [redacted]w[redacted]d with an Estimated Date of Delivery: 09/05/18 being seen today for ongoing management of a high-risk pregnancy complicated by A2DM on glyburide 2.5mg  hs, metformin 1,000mg  pm Today she reports no complaints. Pt has not done childbirth classes. Blood sugars no greater than 119. Single fasting 102 because she forgot to take her medication the night before.   Contractions: Irritability. Vag. Bleeding: None.  Movement: Present. denies leaking of fluid.  Review of Systems:   Pertinent items are noted in HPI Denies abnormal vaginal discharge w/ itching/odor/irritation, headaches, visual changes, shortness of breath, chest pain, abdominal pain, severe nausea/vomiting, or problems with urination or bowel movements unless otherwise stated above. Pertinent History Reviewed:  Reviewed past medical,surgical, social, obstetrical and family history.  Reviewed problem list, medications and allergies. Physical Assessment:   Vitals:   08/18/18 1021  BP: 130/81  Pulse: 73  Weight: 231 lb 3.2 oz (104.9 kg)  Body mass index is 40.96 kg/m.           Physical Examination:   General appearance: alert, well appearing, and in no distress and oriented to person, place, and time  Mental status: alert, oriented to person, place, and time, normal mood, behavior, speech, dress, motor activity, and thought processes, affect appropriate to mood  Skin: warm & dry   Extremities: Edema: Trace    Cardiovascular: normal heart rate noted  Respiratory: normal respiratory effort, no distress  Abdomen: gravid, soft, non-tender  Pelvic: Cervical exam deferred         Fetal Status: Fetal Heart Rate (bpm): 166 u/s  Fundal Height: 38 cm Movement: Present Presentation: Vertex  Fetal Surveillance Testing today: [place] 37+3 wks,cephalic,fhr 166 bpm,BPP 8/8,bilat adnexa's wnl,anterior placenta gr 3,afi 8.7 cm  Results for orders placed or performed in visit on 08/18/18 (from the past 24 hour(s))  POC Urinalysis Dipstick OB   Collection Time: 08/18/18 10:24 AM  Result Value Ref Range   Color, UA     Clarity, UA     Glucose, UA Negative Negative   Bilirubin, UA     Ketones, UA neg    Spec Grav, UA     Blood, UA neg    pH, UA     POC,PROTEIN,UA Negative Negative, Trace   Urobilinogen, UA     Nitrite, UA neg    Leukocytes, UA Negative Negative   Appearance     Odor      Assessment & Plan:  1) High-risk pregnancy G1P0 at [redacted]w[redacted]d with an Estimated Date of Delivery: 09/05/18   2) A2DM, stable, continue glyburide 2.5mg  hs, metformin 1,000mg  qhs   Meds: No orders of the defined types were placed in this encounter.  Labs/procedures today: none  Treatment Plan:  Weekly BPP, IOL scheduled for 08/29/2018 at 7:30am  Reviewed: IOL process explained from Cytotec -> foley bulb-> pitocin. Videotapes encouraged and childbirth tapes info online given to pt.   Term labor symptoms and general obstetric precautions including but not limited to vaginal bleeding, contractions, leaking of fluid and fetal movement were reviewed in detail with the patient.  All questions were answered.  Follow-up: Return in about 1 week (around 08/25/2018) for U/S: BPP,  HROB.  Orders Placed This Encounter  Procedures  . POC Urinalysis Dipstick OB   By signing my name below, I, Pietro Cassis, attest that this documentation has been prepared under the direction and in the presence of Tilda Burrow, MD. Electronically Signed: Pietro Cassis, Medical Scribe. 08/18/18. 10:50 AM.  I personally performed the services described in this documentation, which was SCRIBED in my presence. The recorded information has been reviewed and considered  accurate. It has been edited as necessary during review. Tilda Burrow, MD

## 2018-08-18 NOTE — Progress Notes (Signed)
[place] 37+3 wks,cephalic,fhr 166 bpm,BPP 8/8,bilat adnexa's wnl,anterior placenta gr 3,afi 8.7 cm

## 2018-08-24 ENCOUNTER — Other Ambulatory Visit: Payer: Self-pay | Admitting: Obstetrics and Gynecology

## 2018-08-24 DIAGNOSIS — O24419 Gestational diabetes mellitus in pregnancy, unspecified control: Secondary | ICD-10-CM

## 2018-08-25 ENCOUNTER — Ambulatory Visit (INDEPENDENT_AMBULATORY_CARE_PROVIDER_SITE_OTHER): Payer: Medicaid Other | Admitting: Advanced Practice Midwife

## 2018-08-25 ENCOUNTER — Ambulatory Visit (INDEPENDENT_AMBULATORY_CARE_PROVIDER_SITE_OTHER): Payer: Medicaid Other

## 2018-08-25 ENCOUNTER — Encounter: Payer: Self-pay | Admitting: Advanced Practice Midwife

## 2018-08-25 VITALS — BP 117/73 | HR 83 | Wt 233.0 lb

## 2018-08-25 DIAGNOSIS — Z0283 Encounter for blood-alcohol and blood-drug test: Secondary | ICD-10-CM

## 2018-08-25 DIAGNOSIS — Z331 Pregnant state, incidental: Secondary | ICD-10-CM

## 2018-08-25 DIAGNOSIS — O24419 Gestational diabetes mellitus in pregnancy, unspecified control: Secondary | ICD-10-CM

## 2018-08-25 DIAGNOSIS — Z1389 Encounter for screening for other disorder: Secondary | ICD-10-CM

## 2018-08-25 DIAGNOSIS — Z3A38 38 weeks gestation of pregnancy: Secondary | ICD-10-CM | POA: Diagnosis not present

## 2018-08-25 DIAGNOSIS — O0990 Supervision of high risk pregnancy, unspecified, unspecified trimester: Secondary | ICD-10-CM

## 2018-08-25 LAB — POCT URINALYSIS DIPSTICK OB
Blood, UA: NEGATIVE
Glucose, UA: NEGATIVE
KETONES UA: NEGATIVE
Leukocytes, UA: NEGATIVE
Nitrite, UA: NEGATIVE
POC,PROTEIN,UA: NEGATIVE

## 2018-08-25 NOTE — Progress Notes (Signed)
US 38+3 wks,cephalic,BPP 8/8,FHR 142 bpm,anterior placenta gr 3,afi 12 cm,normal ovaries bilat,EFW 3242 g 43%

## 2018-08-25 NOTE — Progress Notes (Signed)
HIGH-RISK PREGNANCY VISIT Patient name: Carol BullaMagen D Grunden MRN [id number]  Date of birth: May 10, 1991 Chief Complaint:   Routine Prenatal Visit  History of Present Illness:   Carol Hunt is a 27 y.o. G1P0 female at 4347w3d with an Estimated Date of Delivery: 09/05/18 being seen today for ongoing management of a high-risk pregnancy complicated by gestational DM, class  A2 DM.  Today she reports no complaints. Contractions: Irregular. Vag. Bleeding: None.  Movement: Present. denies leaking of fluid.  Review of Systems:   Pertinent items are noted in HPI Denies abnormal vaginal discharge w/ itching/odor/irritation, headaches, visual changes, shortness of breath, chest pain, abdominal pain, severe nausea/vomiting, or problems with urination or bowel movements unless otherwise stated above.    Pertinent History Reviewed:  Medical & Surgical Hx:   Past Medical History:  Diagnosis Date  . Gestational diabetes    metformin and glyburide  . Medical history non-contributory   . Pregnant    Past Surgical History:  Procedure Laterality Date  . MIDDLE EAR SURGERY    . TYMPANOPLASTY     Family History  Problem Relation Age of Onset  . Pancreatitis Father        living  . Diabetes Father   . Diabetes Sister   . Cancer Maternal Aunt        breast   . Diabetes Paternal Aunt   . COPD Paternal Grandfather   . Colon cancer Neg Hx     Current Outpatient Medications:  .  ferrous sulfate 325 (65 FE) MG tablet, Take 1 tablet (325 mg total) by mouth 2 (two) times daily with a meal., Disp: 60 tablet, Rfl: 3 .  glyBURIDE (DIABETA) 2.5 MG tablet, Take 1 tablet (2.5 mg total) by mouth at bedtime., Disp: 30 tablet, Rfl: 3 .  metFORMIN (GLUCOPHAGE) 1000 MG tablet, Take 1 tablet (1,000 mg total) by mouth daily with supper., Disp: 30 tablet, Rfl: 3 .  Prenatal Vit-Fe Fumarate-FA (PRENATAL MULTIVITAMIN) TABS tablet, Take 1 tablet by mouth daily at 12 noon., Disp: , Rfl:  Social History: Reviewed -  reports  that she has quit smoking. Her smoking use included cigarettes. She has never used smokeless tobacco.   Physical Assessment:   Vitals:   08/25/18 1621  BP: 117/73  Pulse: 83  Weight: 233 lb (105.7 kg)  Body mass index is 41.27 kg/m.           Physical Examination:   General appearance: alert, well appearing, and in no distress  Mental status: alert, oriented to person, place, and time  Skin: warm & dry   Extremities: Edema: Trace    Cardiovascular: normal heart rate noted  Respiratory: normal respiratory effort, no distress  Abdomen: gravid, soft, non-tender  Pelvic: Cervical exam performed         Fetal Status:     Movement: Present   US 38+3 wks,cephalic,BPP 8/8,FHR 142 bpm,anterior placenta gr 3,afi 12 cm,normal ovaries bilat,EFW 3242 g 43%  Fetal Surveillance Testing today: US  No results found for this or any previous visit (from the past 24 hour(s)).  Assessment & Plan:  1) High-risk pregnancy G1P0 at 6847w3d with an Estimated Date of Delivery: 09/05/18   2) A2DM, stable.  Treatment Plan:  IOL 11/18 already scheduled  Labs/procedures today: UDS   Follow-up: Return in about 5 weeks (around 09/29/2018) for postpartum.  Future Appointments  Date Time Provider Department Center  08/29/2018  7:30 AM [facility] ROOM [facility] None    Orders Placed This Encounter  Procedures  . ToxASSURE Select 13 (MW), Urine  . POC Urinalysis Dipstick OB   Jacklyn Shell CNM 08/25/2018 4:39 PM

## 2018-08-29 ENCOUNTER — Inpatient Hospital Stay (HOSPITAL_COMMUNITY): Payer: Medicaid Other | Admitting: Anesthesiology

## 2018-08-29 ENCOUNTER — Encounter (HOSPITAL_COMMUNITY): Payer: Self-pay

## 2018-08-29 ENCOUNTER — Inpatient Hospital Stay (HOSPITAL_COMMUNITY)
Admission: RE | Admit: 2018-08-29 | Discharge: 2018-08-31 | DRG: 807 | Disposition: A | Discharge status: Home / Self Care | Payer: Medicaid Other | Attending: Obstetrics and Gynecology | Admitting: Obstetrics and Gynecology

## 2018-08-29 DIAGNOSIS — D509 Iron deficiency anemia, unspecified: Secondary | ICD-10-CM | POA: Diagnosis present

## 2018-08-29 DIAGNOSIS — O24425 Gestational diabetes mellitus in childbirth, controlled by oral hypoglycemic drugs: Principal | ICD-10-CM | POA: Diagnosis present

## 2018-08-29 DIAGNOSIS — O9902 Anemia complicating childbirth: Secondary | ICD-10-CM | POA: Diagnosis present

## 2018-08-29 DIAGNOSIS — Z3A39 39 weeks gestation of pregnancy: Secondary | ICD-10-CM

## 2018-08-29 DIAGNOSIS — Z87891 Personal history of nicotine dependence: Secondary | ICD-10-CM

## 2018-08-29 DIAGNOSIS — D649 Anemia, unspecified: Secondary | ICD-10-CM | POA: Diagnosis present

## 2018-08-29 DIAGNOSIS — O24415 Gestational diabetes mellitus in pregnancy, controlled by oral hypoglycemic drugs: Secondary | ICD-10-CM | POA: Diagnosis present

## 2018-08-29 DIAGNOSIS — O0990 Supervision of high risk pregnancy, unspecified, unspecified trimester: Secondary | ICD-10-CM

## 2018-08-29 LAB — CBC
HEMATOCRIT: 34.3 % — AB (ref 36.0–46.0)
HEMOGLOBIN: 11.1 g/dL — AB (ref 12.0–15.0)
MCH: 29.4 pg (ref 26.0–34.0)
MCHC: 32.4 g/dL (ref 30.0–36.0)
MCV: 90.7 fL (ref 80.0–100.0)
Platelets: 244 10*3/uL (ref 150–400)
RBC: 3.78 MIL/uL — AB (ref 3.87–5.11)
RDW: 13.4 % (ref 11.5–15.5)
WBC: 10.4 10*3/uL (ref 4.0–10.5)
nRBC: 0 % (ref 0.0–0.2)

## 2018-08-29 LAB — URINALYSIS, ROUTINE W REFLEX MICROSCOPIC
BILIRUBIN URINE: NEGATIVE
Glucose, UA: NEGATIVE mg/dL
HGB URINE DIPSTICK: NEGATIVE
Ketones, ur: NEGATIVE mg/dL
NITRITE: NEGATIVE
PROTEIN: NEGATIVE mg/dL
Specific Gravity, Urine: 1.019 (ref 1.005–1.030)
pH: 5 (ref 5.0–8.0)

## 2018-08-29 LAB — TYPE AND SCREEN
ABO/RH(D): O POS
Antibody Screen: NEGATIVE

## 2018-08-29 LAB — GLUCOSE, CAPILLARY
Glucose-Capillary: 101 mg/dL — ABNORMAL HIGH (ref 70–99)
Glucose-Capillary: 80 mg/dL (ref 70–99)
Glucose-Capillary: 78 mg/dL (ref 70–99)
Glucose-Capillary: 86 mg/dL (ref 70–99)

## 2018-08-29 LAB — ABO/RH: ABO/RH(D): O POS

## 2018-08-29 MED ORDER — SOD CITRATE-CITRIC ACID 500-334 MG/5ML PO SOLN: 30.0000 mL | ORAL | Status: DC | PRN

## 2018-08-29 MED ORDER — TERBUTALINE SULFATE 1 MG/ML IJ SOLN
0.2500 mg | Freq: Once | INTRAMUSCULAR | Status: DC | PRN
  Filled 2018-08-29: qty 1

## 2018-08-29 MED ORDER — OXYTOCIN 40 UNITS IN LACTATED RINGERS INFUSION - SIMPLE MED
1.0000 m[IU]/min | INTRAVENOUS | Status: DC
  Administered 2018-08-29: 2 m[IU]/min via INTRAVENOUS
  Filled 2018-08-29: qty 1000

## 2018-08-29 MED ORDER — EPHEDRINE 5 MG/ML INJ
10.0000 mg | INTRAVENOUS | Status: DC | PRN
  Filled 2018-08-29: qty 2

## 2018-08-29 MED ORDER — MISOPROSTOL 50MCG HALF TABLET
50.0000 ug | ORAL_TABLET | ORAL | Status: DC | PRN
  Administered 2018-08-29: 50 ug via BUCCAL
  Filled 2018-08-29 (×2): qty 1

## 2018-08-29 MED ORDER — FLEET ENEMA 7-19 GM/118ML RE ENEM: 1.0000 | ENEMA | RECTAL | Status: DC | PRN

## 2018-08-29 MED ORDER — OXYCODONE-ACETAMINOPHEN 5-325 MG PO TABS: 1.0000 | ORAL_TABLET | ORAL | Status: DC | PRN

## 2018-08-29 MED ORDER — PHENYLEPHRINE 40 MCG/ML (10ML) SYRINGE FOR IV PUSH (FOR BLOOD PRESSURE SUPPORT)
80.0000 ug | PREFILLED_SYRINGE | INTRAVENOUS | Status: DC | PRN
  Filled 2018-08-29: qty 5

## 2018-08-29 MED ORDER — LACTATED RINGERS IV SOLN: 500.0000 mL | Freq: Once | INTRAVENOUS | Status: DC

## 2018-08-29 MED ORDER — PHENYLEPHRINE 40 MCG/ML (10ML) SYRINGE FOR IV PUSH (FOR BLOOD PRESSURE SUPPORT): 80.0000 ug | PREFILLED_SYRINGE | INTRAVENOUS | Status: DC | PRN

## 2018-08-29 MED ORDER — FENTANYL 2.5 MCG/ML BUPIVACAINE 1/10 % EPIDURAL INFUSION (WH - ANES)
14.0000 mL/h | INTRAMUSCULAR | Status: DC | PRN
  Administered 2018-08-29: 14 mL/h via EPIDURAL
  Filled 2018-08-29: qty 100

## 2018-08-29 MED ORDER — FENTANYL CITRATE (PF) 100 MCG/2ML IJ SOLN
100.0000 ug | INTRAMUSCULAR | Status: DC | PRN
  Administered 2018-08-29: 100 ug via INTRAVENOUS
  Filled 2018-08-29: qty 2

## 2018-08-29 MED ORDER — OXYTOCIN BOLUS FROM INFUSION
500.0000 mL | Freq: Once | INTRAVENOUS | Status: AC
  Administered 2018-08-29: 500 mL via INTRAVENOUS

## 2018-08-29 MED ORDER — ONDANSETRON HCL 4 MG/2ML IJ SOLN
4.0000 mg | Freq: Four times a day (QID) | INTRAMUSCULAR | Status: DC | PRN
  Filled 2018-08-29: qty 2

## 2018-08-29 MED ORDER — LIDOCAINE HCL (PF) 1 % IJ SOLN
30.0000 mL | INTRAMUSCULAR | Status: DC | PRN
  Administered 2018-08-29: 30 mL via SUBCUTANEOUS
  Filled 2018-08-29: qty 30

## 2018-08-29 MED ORDER — PHENYLEPHRINE 40 MCG/ML (10ML) SYRINGE FOR IV PUSH (FOR BLOOD PRESSURE SUPPORT)
80.0000 ug | PREFILLED_SYRINGE | INTRAVENOUS | Status: DC | PRN
  Filled 2018-08-29: qty 5
  Filled 2018-08-29: qty 10

## 2018-08-29 MED ORDER — DIPHENHYDRAMINE HCL 50 MG/ML IJ SOLN: 12.5000 mg | INTRAMUSCULAR | Status: DC | PRN

## 2018-08-29 MED ORDER — EPHEDRINE 5 MG/ML INJ: 10.0000 mg | INTRAVENOUS | Status: DC | PRN

## 2018-08-29 MED ORDER — LIDOCAINE HCL (PF) 1 % IJ SOLN
INTRAMUSCULAR | Status: DC | PRN
  Administered 2018-08-29: 5 mL via EPIDURAL

## 2018-08-29 MED ORDER — OXYTOCIN 40 UNITS IN LACTATED RINGERS INFUSION - SIMPLE MED: 2.5000 [IU]/h | INTRAVENOUS | Status: DC

## 2018-08-29 MED ORDER — ACETAMINOPHEN 325 MG PO TABS: 650.0000 mg | ORAL_TABLET | ORAL | Status: DC | PRN

## 2018-08-29 MED ORDER — LACTATED RINGERS IV SOLN
500.0000 mL | INTRAVENOUS | Status: DC | PRN
  Administered 2018-08-29: 500 mL via INTRAVENOUS

## 2018-08-29 MED ORDER — LACTATED RINGERS IV SOLN
INTRAVENOUS | Status: DC
  Administered 2018-08-29 (×4): via INTRAVENOUS

## 2018-08-29 MED ORDER — MISOPROSTOL 25 MCG QUARTER TABLET
25.0000 ug | ORAL_TABLET | ORAL | Status: DC | PRN
  Administered 2018-08-29: 25 ug via VAGINAL
  Filled 2018-08-29: qty 1

## 2018-08-29 MED ORDER — OXYCODONE-ACETAMINOPHEN 5-325 MG PO TABS: 2.0000 | ORAL_TABLET | ORAL | Status: DC | PRN

## 2018-08-29 NOTE — Progress Notes (Signed)
LABOR PROGRESS NOTE  Carol Hunt is a 27 y.o. G1P0 at 5125w0d  Subjective: Cervix check- patient doing well. Foley bulb is out spontaneously. Patient wants to wait for the epideral placement longer.   Objective: BP (!) 116/59   Pulse 81   Temp 97.9 F (36.6 C) (Oral)   Resp 18   Ht 5\' 3"  (1.6 m)   Wt 106.5 kg   LMP 11/29/2017 (Exact Date)   BMI 41.61 kg/m  or  Vitals:   08/29/18 1313 08/29/18 1415 08/29/18 1501 08/29/18 1611  BP: 131/76   (!) 116/59  Pulse: 76   81  Resp: 20 18 20 18   Temp:    97.9 F (36.6 C)  TempSrc:    Oral  Weight:      Height:        Dilation: 4.5 Effacement (%): 70 Station: -2 Presentation: Vertex Exam by:: Valentina Lucks. Woods, RN and myself FHT: baseline rate 160, moderate varibility, normal acel, rare variable, with no decel Toco: cytotec x2  Labs: Lab Results  Component Value Date   WBC 10.4 08/29/2018   HGB 11.1 (L) 08/29/2018   HCT 34.3 (L) 08/29/2018   MCV 90.7 08/29/2018   PLT 244 08/29/2018    Patient Active Problem List   Diagnosis Date Noted  . Gestational diabetes mellitus (GDM) in third trimester controlled on oral hypoglycemic drug 08/29/2018  . Gestational diabetes mellitus, class A2 06/07/2018  . Chlamydia infection affecting pregnancy in first trimester 02/14/2018  . Supervision of high risk pregnancy, antepartum 02/09/2018  . Foreign body of forearm, superficial 12/05/2012  . Rectal bleeding 01/21/2011  . Anemia 01/21/2011    Assessment / Plan: 27 y.o. G1P0 at 5725w0d  Labor: exam showed dilation of cervix to about 4.5-5cm and about 70% effacement, soft cervix. Foley bulb spontaneously removed prior to exam. Will initiate pitocin at this time. Fetal Wellbeing:  Category I Pain Control:  PRN fentanyl IV q1hr, patient does not want epidural at this time  Anticipated MOD:  SVD  [name], Julita Ozbun DO 08/29/2018, 5:12 PM

## 2018-08-29 NOTE — Anesthesia Preprocedure Evaluation (Signed)
Anesthesia Evaluation  Patient identified by MRN, date of birth, ID band Patient awake    Reviewed: Allergy & Precautions, H&P , NPO status , Patient's Chart, lab work & pertinent test results  Airway Mallampati: III  TM Distance: >3 FB Neck ROM: Full    Dental no notable dental hx. (+) Teeth Intact, Dental Advisory Given   Pulmonary former smoker,    Pulmonary exam normal breath sounds clear to auscultation       Cardiovascular Exercise Tolerance: Good negative cardio ROS Normal cardiovascular exam Rhythm:Regular Rate:Normal     Neuro/Psych negative neurological ROS  negative psych ROS   GI/Hepatic negative GI ROS, Neg liver ROS,   Endo/Other  diabetes, Gestational  Renal/GU negative Renal ROS     Musculoskeletal   Abdominal (+) + obese,   Peds  Hematology  (+) Blood dyscrasia, anemia ,   Anesthesia Other Findings   Reproductive/Obstetrics (+) Pregnancy                             Lab Results  Component Value Date   WBC 10.4 08/29/2018   HGB 11.1 (L) 08/29/2018   HCT 34.3 (L) 08/29/2018   MCV 90.7 08/29/2018   PLT 244 08/29/2018    Anesthesia Physical Anesthesia Plan  ASA: III  Anesthesia Plan: Epidural   Post-op Pain Management:    Induction:   PONV Risk Score and Plan: Treatment may vary due to age or medical condition  Airway Management Planned:   Additional Equipment:   Intra-op Plan:   Post-operative Plan:   Informed Consent: I have reviewed the patients History and Physical, chart, labs and discussed the procedure including the risks, benefits and alternatives for the proposed anesthesia with the patient or authorized representative who has indicated his/her understanding and acceptance.     Plan Discussed with:   Anesthesia Plan Comments:         Anesthesia Quick Evaluation

## 2018-08-29 NOTE — Progress Notes (Signed)
OB/GYN Faculty Practice: Labor Progress Note  Subjective: Doing well, just went to the bathroom. Family in room. Feels contractions much more regularly now.  Objective: BP 139/77   Pulse 73   Temp 98.4 F (36.9 C) (Oral)   Resp 18   Ht 5\' 3"  (1.6 m)   Wt 106.5 kg   LMP 11/29/2017 (Exact Date)   BMI 41.61 kg/m  Gen: well-appearing, NAD Dilation: 5 Effacement (%): 70 Station: -3 Presentation: Vertex Exam by:: Dr. Earlene PlaterWallace  Assessment and Plan: Carol Hunt is a 27 y.o. G1P0 at 6561w0d here for induction of labor for A2GDM.  Labor: Latent. S/p cytotec x2 and FB now out. Started pitocin at 5pm, continue to titrate per protocol.  -- AROM clear fluid  -- pain control: open to epidural, would like to defer as long as possible  Fetal Wellbeing: EFW 36w4: 2976g (54%). Cephalic. -- GBS (negative) -- continuous fetal monitoring - category I strip   A2GDM: Mostly elevated FBG. Well-controlled with metformin and glyburide.  -- check CBG every 4 hours in latent labor then every 2 hours in active  [place]brook[place]urel S. Earlene PlaterWallace, DO OB/GYN Fellow, Faculty Practice

## 2018-08-29 NOTE — Progress Notes (Signed)
Gunnar BullaMagen D Carreira is a 27 y.o. G1P0 at 7055w0d.  Subjective: Cervix check- patient doing well. No complaints. Anxious.  Objective: BP 131/76   Pulse 76   Temp 97.8 F (36.6 C) (Oral)   Resp 20   Ht 5\' 3"  (1.6 m)   Wt 106.5 kg   LMP 11/29/2017 (Exact Date)   BMI 41.61 kg/m    FHT:  FHR: 160 bpm, variability: moderate,  accelerations:  persent,  decelerations:  absent UC:   Q occasional  Dilation: 1.5 Effacement (%): 70 Station: -1 Presentation: Vertex Exam by:: Dr. Earlene PlaterWallace  Labs: Results for orders placed or performed during the hospital encounter of 08/29/18 (from the past 24 hour(s))  Glucose, capillary     Status: Abnormal   Collection Time: 08/29/18  7:51 AM  Result Value Ref Range   Glucose-Capillary 101 (H) 70 - 99 mg/dL  CBC     Status: Abnormal   Collection Time: 08/29/18  8:00 AM  Result Value Ref Range   WBC 10.4 4.0 - 10.5 K/uL   RBC 3.78 (L) 3.87 - 5.11 MIL/uL   Hemoglobin 11.1 (L) 12.0 - 15.0 g/dL   HCT 16.134.3 (L) 09.636.0 - 04.546.0 %   MCV 90.7 80.0 - 100.0 fL   MCH 29.4 26.0 - 34.0 pg   MCHC 32.4 30.0 - 36.0 g/dL   RDW 40.913.4 81.111.5 - 91.415.5 %   Platelets 244 150 - 400 K/uL   nRBC 0.0 0.0 - 0.2 %  Type and screen     Status: None   Collection Time: 08/29/18  8:00 AM  Result Value Ref Range   ABO/RH(D) O POS    Antibody Screen NEG    Sample Expiration      09/01/2018 Performed at [facility], [address] [place], [place] [address]   ABO/Rh     Status: None   Collection Time: 08/29/18  8:00 AM  Result Value Ref Range   ABO/RH(D)      O POS Performed at [facility], [address] [place], [place] [address]   Urinalysis, Routine w reflex microscopic     Status: Abnormal   Collection Time: 08/29/18  8:11 AM  Result Value Ref Range   Color, Urine YELLOW YELLOW   APPearance HAZY (A) CLEAR   Specific Gravity, Urine 1.019 1.005 - 1.030   pH 5.0 5.0 - 8.0   Glucose, UA NEGATIVE NEGATIVE mg/dL   Hgb urine dipstick NEGATIVE NEGATIVE   Bilirubin  Urine NEGATIVE NEGATIVE   Ketones, ur NEGATIVE NEGATIVE mg/dL   Protein, ur NEGATIVE NEGATIVE mg/dL   Nitrite NEGATIVE NEGATIVE   Leukocytes, UA MODERATE (A) NEGATIVE   RBC / HPF 0-5 0 - 5 RBC/hpf   WBC, UA 21-50 0 - 5 WBC/hpf   Bacteria, UA RARE (A) NONE SEEN   Squamous Epithelial / LPF 0-5 0 - 5   Mucus PRESENT   Glucose, capillary     Status: None   Collection Time: 08/29/18 12:14 PM  Result Value Ref Range   Glucose-Capillary 80 70 - 99 mg/dL    Assessment / Plan: 755w0d week IUP Labor: induction started with cytotec- exam unchanged from previous. Will give second dose of cytotec and place foley bulb Fetal Wellbeing:  Category I Pain Control:  none Anticipated MOD:  SVD  Leeroy Bocknderson, Jaton Eilers L, DO 08/29/2018 1:18 PM

## 2018-08-29 NOTE — H&P (Signed)
OBSTETRIC ADMISSION HISTORY AND PHYSICAL  Carol Hunt is a 27 y.o. female G1P0 with IUP at [redacted]w[redacted]d by L/8 presenting for induction of labor for A2GDM (metformin, glyburide).   Reports fetal movement. Denies vaginal bleeding, denies leakage of fluids but reports mucous-like discharge about 3 weeks ago.   She received her prenatal care at [facility].  Support person in labor: Partner, Maurine Minister  friend, Morrie Sheldon  mother, French Ana  Ultrasounds  5w2: gestational sac  8w1: dating   13w2: normal NT 1.56mm, size c/w dates  18w2: anterior placenta, EFW 24%, limited spinal and heart views otherwise normal anatomy scan  22w2: EWF 20%, spinal anatomy scan complete  26w3: anatomy of heart complete  36w4: 2976g (54%)   Prenatal History/Complications: . A2GDM - glyburide, metformin nightly  . Iron deficiency anemia - iron  . Chlamydia - TOC negative   Past Medical History: Past Medical History:  Diagnosis Date  . Gestational diabetes    metformin and glyburide  . Medical history non-contributory   . Pregnant     Past Surgical History: Past Surgical History:  Procedure Laterality Date  . MIDDLE EAR SURGERY    . TYMPANOPLASTY      Obstetrical History: OB History    Gravida  1   Para      Term      Preterm      AB      Living        SAB      TAB      Ectopic      Multiple      Live Births              Social History: Social History   Socioeconomic History  . Marital status: Single    Spouse name: Not on file  . Number of children: Not on file  . Years of education: Not on file  . Highest education level: Not on file  Occupational History  . Not on file  Social Needs  . Financial resource strain: Not hard at all  . Food insecurity:    Worry: Never true    Inability: Never true  . Transportation needs:    Medical: No    Non-medical: Not on file  Tobacco Use  . Smoking status: Former Smoker    Types: Cigarettes  . Smokeless tobacco: Never Used   Substance and Sexual Activity  . Alcohol use: No  . Drug use: No  . Sexual activity: Yes    Birth control/protection: None  Lifestyle  . Physical activity:    Days per week: Not on file    Minutes per session: Not on file  . Stress: Not at all  Relationships  . Social connections:    Talks on phone: Not on file    Gets together: Not on file    Attends religious service: Not on file    Active member of club or organization: Not on file    Attends meetings of clubs or organizations: Not on file    Relationship status: Not on file  Other Topics Concern  . Not on file  Social History Narrative  . Not on file    Family History: Family History  Problem Relation Age of Onset  . Pancreatitis Father        living  . Diabetes Father   . Diabetes Sister   . Cancer Maternal Aunt        breast   . Diabetes Paternal Aunt   .  COPD Paternal Grandfather   . Colon cancer Neg Hx     Allergies: No Known Allergies  Medications Prior to Admission  Medication Sig Dispense Refill Last Dose  . ferrous sulfate 325 (65 FE) MG tablet Take 1 tablet (325 mg total) by mouth 2 (two) times daily with a meal. 60 tablet 3 08/28/2018 at   . glyBURIDE (DIABETA) 2.5 MG tablet Take 1 tablet (2.5 mg total) by mouth at bedtime. 30 tablet 3 08/28/2018 at Unknown time  . metFORMIN (GLUCOPHAGE) 1000 MG tablet Take 1 tablet (1,000 mg total) by mouth daily with supper. 30 tablet 3 08/28/2018 at Unknown time  . Prenatal Vit-Fe Fumarate-FA (PRENATAL MULTIVITAMIN) TABS tablet Take 1 tablet by mouth daily at 12 noon.   08/28/2018 at Unknown time     Review of Systems  All systems reviewed and negative except as stated in HPI  Blood pressure 122/76, pulse 79, temperature 97.6 F (36.4 C), temperature source Oral, resp. rate 18, height 5\' 3"  (1.6 m), weight 106.5 kg, last menstrual period 11/29/2017. General appearance: alert, well-appearing, NAD Lungs: no respiratory distress Heart: regular rate  Abdomen:  soft, non-tender; gravid Pelvic: deferred Extremities: Homans sign is negative, no sign of DVT Presentation: cephalic by prior check  Fetal monitoring: 150/mod/+a/-d Uterine activity: irritability  Dilation: 1.5 Effacement (%): 70 Station: -1 Exam by:: Welford Roche, RNC  Prenatal labs: ABO, Rh: --/--/O POS (11/18 0800) Antibody: NEG (11/18 0800) Rubella: 1.00 (05/01 1606) RPR: Non Reactive (08/22 0914)  HBsAg: Negative (05/01 1606)  HIV: Non Reactive (08/22 0914)  GBS:   negative Glucola: A2GDM Genetic screening:  Normal NT  Prenatal Transfer Tool  Maternal Diabetes: Yes:  Diabetes Type:  Insulin/Medication controlled Genetic Screening: Normal NT Maternal Ultrasounds/Referrals: Normal Fetal Ultrasounds or other Referrals:  None Maternal Substance Abuse:  No Significant Maternal Medications:  Ferrous sulfate, PNV, glyburide, metformin Significant Maternal Lab Results: None  Results for orders placed or performed during the hospital encounter of 08/29/18 (from the past 24 hour(s))  Glucose, capillary   Collection Time: 08/29/18  7:51 AM  Result Value Ref Range   Glucose-Capillary 101 (H) 70 - 99 mg/dL  CBC   Collection Time: 08/29/18  8:00 AM  Result Value Ref Range   WBC 10.4 4.0 - 10.5 K/uL   RBC 3.78 (L) 3.87 - 5.11 MIL/uL   Hemoglobin 11.1 (L) 12.0 - 15.0 g/dL   HCT 16.1 (L) 09.6 - 04.5 %   MCV 90.7 80.0 - 100.0 fL   MCH 29.4 26.0 - 34.0 pg   MCHC 32.4 30.0 - 36.0 g/dL   RDW 40.9 81.1 - 91.4 %   Platelets 244 150 - 400 K/uL   nRBC 0.0 0.0 - 0.2 %  Type and screen   Collection Time: 08/29/18  8:00 AM  Result Value Ref Range   ABO/RH(D) O POS    Antibody Screen NEG    Sample Expiration      09/01/2018 Performed at [facility], [address] [place], [place] [address]   Urinalysis, Routine w reflex microscopic   Collection Time: 08/29/18  8:11 AM  Result Value Ref Range   Color, Urine YELLOW YELLOW   APPearance HAZY (A) CLEAR   Specific  Gravity, Urine 1.019 1.005 - 1.030   pH 5.0 5.0 - 8.0   Glucose, UA NEGATIVE NEGATIVE mg/dL   Hgb urine dipstick NEGATIVE NEGATIVE   Bilirubin Urine NEGATIVE NEGATIVE   Ketones, ur NEGATIVE NEGATIVE mg/dL   Protein, ur NEGATIVE NEGATIVE mg/dL  Nitrite NEGATIVE NEGATIVE   Leukocytes, UA MODERATE (A) NEGATIVE   RBC / HPF 0-5 0 - 5 RBC/hpf   WBC, UA 21-50 0 - 5 WBC/hpf   Bacteria, UA RARE (A) NONE SEEN   Squamous Epithelial / LPF 0-5 0 - 5   Mucus PRESENT     Patient Active Problem List   Diagnosis Date Noted  . Gestational diabetes mellitus (GDM) in third trimester controlled on oral hypoglycemic drug 08/29/2018  . Gestational diabetes mellitus, class A2 06/07/2018  . Chlamydia infection affecting pregnancy in first trimester 02/14/2018  . Supervision of high risk pregnancy, antepartum 02/09/2018  . Foreign body of forearm, superficial 12/05/2012  . Rectal bleeding 01/21/2011  . Anemia 01/21/2011    Assessment/Plan:  Gunnar BullaMagen D Buren is a 27 y.o. G1P0 at 2331w0d here for induction of labor for A2GDM.  Labor: Bishops score 4 at time of induction. Counseled on methods, received cytotec x 1.  -- pain control: open to epidural, would like to defer as long as possible  Fetal Wellbeing: EFW 36w4: 2976g (54%). Cephalic by RN check.  -- GBS (negative) -- continuous fetal monitoring - category I strip   A2GDM: Mostly elevated FBG. Well-controlled with metformin and glyburide.  -- check CBG every 4 hours in latent labor then every 2 hours in active  Postpartum Planning -- breast/Mirena  -- RI/[x] Tdap   Linkoln Alkire S. Earlene PlaterWallace, DO OB/GYN Fellow

## 2018-08-29 NOTE — Anesthesia Procedure Notes (Signed)
Epidural Patient location during procedure: OB Start time: 08/29/2018 9:18 PM End time: 08/29/2018 9:33 PM  Staffing Anesthesiologist: Trevor IhaHouser, Avamae Dehaan A, MD Performed: anesthesiologist   Preanesthetic Checklist Completed: patient identified, site marked, surgical consent, pre-op evaluation, timeout performed, IV checked, risks and benefits discussed and monitors and equipment checked  Epidural Patient position: sitting Prep: site prepped and draped and DuraPrep Patient monitoring: continuous pulse ox and blood pressure Approach: midline Location: L3-L4 Injection technique: LOR air  Needle:  Needle type: Tuohy  Needle gauge: 17 G Needle length: 9 cm and 9 Needle insertion depth: 8 cm Catheter type: closed end flexible Catheter size: 19 Gauge Catheter at skin depth: 13 cm Test dose: negative  Assessment Events: blood not aspirated, injection not painful, no injection resistance, negative IV test and no paresthesia  Additional Notes 2 attempts. Pt tolerated procedure well

## 2018-08-29 NOTE — Anesthesia Pain Management Evaluation Note (Signed)
  CRNA Pain Management Visit Note  Patient: Carol Hunt, 27 y.o., female  "Hello I am a [facility] the [facility]. We have an anesthesia team available at all times to provide care throughout the hospital, including epidural management and anesthesia for C-section. I don't know your plan for the delivery whether it a natural birth, water birth, IV sedation, nitrous supplementation, doula or epidural, but we want to meet your pain goals."   1.Was your pain managed to your expectations on prior hospitalizations?   No prior hospitalizations  2.What is your expectation for pain management during this hospitalization?     Epidural  3.How can we help you reach that goal? Placed epidural at pain goal:5.  Record the patient's initial score and the patient's pain goal.   Pain: 0  Pain Goal: 5 [facility] wants you to be able to say your pain was always managed very well.  Tinlee Navarrette 08/29/2018

## 2018-08-30 ENCOUNTER — Encounter (HOSPITAL_COMMUNITY): Payer: Self-pay

## 2018-08-30 LAB — GLUCOSE, CAPILLARY: Glucose-Capillary: 109 mg/dL — ABNORMAL HIGH (ref 70–99)

## 2018-08-30 LAB — RPR: RPR: NONREACTIVE

## 2018-08-30 MED ORDER — ONDANSETRON HCL 4 MG PO TABS: 4.0000 mg | ORAL_TABLET | ORAL | Status: DC | PRN

## 2018-08-30 MED ORDER — PRENATAL MULTIVITAMIN CH
1.0000 | ORAL_TABLET | Freq: Every day | ORAL | Status: DC
  Administered 2018-08-30 – 2018-08-31 (×2): 1 via ORAL
  Filled 2018-08-30 (×2): qty 1

## 2018-08-30 MED ORDER — IBUPROFEN 600 MG PO TABS
600.0000 mg | ORAL_TABLET | Freq: Four times a day (QID) | ORAL | Status: DC
  Administered 2018-08-30 – 2018-08-31 (×6): 600 mg via ORAL
  Filled 2018-08-30 (×6): qty 1

## 2018-08-30 MED ORDER — DIPHENHYDRAMINE HCL 25 MG PO CAPS: 25.0000 mg | ORAL_CAPSULE | Freq: Four times a day (QID) | ORAL | Status: DC | PRN

## 2018-08-30 MED ORDER — OXYCODONE HCL 5 MG PO TABS: 5.0000 mg | ORAL_TABLET | ORAL | Status: DC | PRN

## 2018-08-30 MED ORDER — ACETAMINOPHEN 325 MG PO TABS: 650.0000 mg | ORAL_TABLET | ORAL | Status: DC | PRN

## 2018-08-30 MED ORDER — DIBUCAINE 1 % RE OINT
1.0000 "application " | TOPICAL_OINTMENT | RECTAL | Status: DC | PRN
  Filled 2018-08-30: qty 28

## 2018-08-30 MED ORDER — MEASLES, MUMPS & RUBELLA VAC IJ SOLR
0.5000 mL | Freq: Once | INTRAMUSCULAR | Status: DC
  Filled 2018-08-30: qty 0.5

## 2018-08-30 MED ORDER — FERROUS SULFATE 325 (65 FE) MG PO TABS
325.0000 mg | ORAL_TABLET | Freq: Two times a day (BID) | ORAL | Status: DC
  Administered 2018-08-30 – 2018-08-31 (×3): 325 mg via ORAL
  Filled 2018-08-30 (×3): qty 1

## 2018-08-30 MED ORDER — WITCH HAZEL-GLYCERIN EX PADS: 1.0000 "application " | MEDICATED_PAD | CUTANEOUS | Status: DC | PRN

## 2018-08-30 MED ORDER — COCONUT OIL OIL: 1.0000 "application " | TOPICAL_OIL | Status: DC | PRN

## 2018-08-30 MED ORDER — SIMETHICONE 80 MG PO CHEW: 80.0000 mg | CHEWABLE_TABLET | ORAL | Status: DC | PRN

## 2018-08-30 MED ORDER — ONDANSETRON HCL 4 MG/2ML IJ SOLN: 4.0000 mg | INTRAMUSCULAR | Status: DC | PRN

## 2018-08-30 MED ORDER — BENZOCAINE-MENTHOL 20-0.5 % EX AERO
1.0000 "application " | INHALATION_SPRAY | CUTANEOUS | Status: DC | PRN
  Administered 2018-08-30: 1 via TOPICAL
  Filled 2018-08-30: qty 56

## 2018-08-30 MED ORDER — ZOLPIDEM TARTRATE 5 MG PO TABS: 5.0000 mg | ORAL_TABLET | Freq: Every evening | ORAL | Status: DC | PRN

## 2018-08-30 MED ORDER — SENNOSIDES-DOCUSATE SODIUM 8.6-50 MG PO TABS
2.0000 | ORAL_TABLET | ORAL | Status: DC
  Administered 2018-08-30: 2 via ORAL
  Filled 2018-08-30: qty 2

## 2018-08-30 MED ORDER — TETANUS-DIPHTH-ACELL PERTUSSIS 5-2.5-18.5 LF-MCG/0.5 IM SUSP: 0.5000 mL | Freq: Once | INTRAMUSCULAR | Status: DC

## 2018-08-30 NOTE — Anesthesia Postprocedure Evaluation (Signed)
Anesthesia Post Note  Patient: Carol Hunt  Procedure(s) Performed: AN AD HOC LABOR EPIDURAL     Patient location during evaluation: Mother Baby Anesthesia Type: Epidural Level of consciousness: awake and alert Pain management: pain level controlled Vital Signs Assessment: post-procedure vital signs reviewed and stable Respiratory status: spontaneous breathing, nonlabored ventilation and respiratory function stable Cardiovascular status: stable Postop Assessment: no headache, no backache, epidural receding, no apparent nausea or vomiting, patient able to bend at knees, adequate PO intake and able to ambulate Anesthetic complications: no    Last Vitals:  Vitals:   08/30/18 0537 08/30/18 1000  BP: 107/60 108/62  Pulse: (!) 107 97  Resp: 18 16  Temp: 37.4 C 36.6 C  SpO2:  99%    Last Pain:  Vitals:   08/30/18 1158  TempSrc:   PainSc: 3    Pain Goal: Patients Stated Pain Goal: 0 (08/30/18 1000)               Vollie Brunty Hristova

## 2018-08-30 NOTE — Lactation Note (Signed)
This note was copied from a baby's chart. Lactation Consultation Note  Patient Name: Carol Modesta MessingMagen Crownover Today's Date: 08/30/2018 Reason for consult: Initial assessment;Term;[facility] Breastfeeding consultation services and support information given and reviewed.  Mom states she plans on both breast and formula feeding.  Baby is 4312 hours old and has latched to breast.  Mom attempted giving formula but baby was not interested.  Instructed to watch for feeding cues and call for assist prn.  Maternal Data    Feeding Feeding Type: Breast Fed  LATCH Score                   Interventions    Lactation Tools Discussed/Used     Consult Status Consult Status: Follow-up Date: 08/31/18 Follow-up type: In-patient    Huston FoleyMOULDEN, Laine Giovanetti S 08/30/2018, 11:42 AM

## 2018-08-30 NOTE — Progress Notes (Signed)
POSTPARTUM PROGRESS NOTE  Post Partum Day 1  Subjective:  Carol Hunt is a 27 y.o. G1P1001 s/p NSVD with IOL for A2GDM at 6978w0d.  She reports she is doing well. No acute events overnight. She denies any problems with ambulating, voiding or po intake. Denies nausea or vomiting.  No pain this AM. Lochia is stable. Passing gas. No BMs. No SOB, dizziness, or HA.  Objective: Blood pressure 107/60, pulse (!) 107, temperature 99.3 F (37.4 C), temperature source Oral, resp. rate 18, height 5\' 3"  (1.6 m), weight 106.5 kg, last menstrual period 11/29/2017, SpO2 100 %, unknown if currently breastfeeding.  Physical Exam:  General: alert, cooperative and no distress Chest: no respiratory distress Heart:regular rate, distal pulses intact Abdomen: soft, nontender,  Uterine Fundus: firm, appropriately tender DVT Evaluation: No calf swelling or tenderness Extremities: No edema Skin: warm, dry  Recent Labs    08/29/18 0800  HGB 11.1*  HCT 34.3*    Assessment/Plan: Carol Hunt is a 27 y.o. G1P1001 s/p NSVD with IOL for A2GDM (glyburide/metformin) at 7778w0d   PPD#1 - Doing well -- Routine postpartum care -- BG this AM elevated (109), not true fasting. Will get 2-hr GTT at follow-up visit -- Mildly tachycardic and hypotensive this morning, but given that patient is asymptomatic and Hgb prior to delivery was 11.1, will defer CBC at this time (EBL during delivery 566 cc) and CTM for change or worsening in symptoms    Contraception: Mirena IUD Feeding: Breast and bottle Dispo: Plan for discharge tomorrow   LOS: 1 day   Raul DelFrancie Jenkins, MS3  08/30/2018, 7:41 AM  Attestation: I have seen this patient and agree with the medical student's documentation. I have examined them separately, and we have discussed the plan of care.  Cristal DeerLaurel S. Earlene PlaterWallace, DO OB/GYN Fellow

## 2018-08-31 ENCOUNTER — Encounter (HOSPITAL_COMMUNITY): Payer: Self-pay

## 2018-08-31 LAB — GLUCOSE, CAPILLARY: GLUCOSE-CAPILLARY: 89 mg/dL (ref 70–99)

## 2018-08-31 LAB — BIRTH TISSUE RECOVERY COLLECTION (PLACENTA DONATION)

## 2018-08-31 MED ORDER — TETANUS-DIPHTH-ACELL PERTUSSIS 5-2.5-18.5 LF-MCG/0.5 IM SUSP: 0.5000 mL | Freq: Once | INTRAMUSCULAR | 0 refills | Status: AC

## 2018-08-31 MED ORDER — MEASLES, MUMPS & RUBELLA VAC IJ SOLR: 0.5000 mL | Freq: Once | INTRAMUSCULAR | 0 refills | Status: AC

## 2018-08-31 MED ORDER — IBUPROFEN 600 MG PO TABS: 600.0000 mg | ORAL_TABLET | Freq: Four times a day (QID) | ORAL | 0 refills | Status: AC

## 2018-08-31 NOTE — Lactation Note (Addendum)
This note was copied from a baby's chart. Lactation Consultation Note  Patient Name: Boy Modesta MessingMagen Pelster Today's Date: 08/31/2018    P1, Baby 35 hours old.  Mother has been primarily bottle formula feeding because she was doubting her milk supply. Reviewed hand expression with good flow expressed and mother appeared encouraged. Assisted with latching baby in football hold on both breasts working on depth and breast compression. Sucks and swalows observed.  Suggest breastfeeding before formula feeding to help establish her milk supply. Mom encouraged to feed baby 8-12 times/24 hours and with feeding cues.  Reviewed engorgement care and monitoring voids/stools. Provide mother with manual pump.    Maternal Data    Feeding Feeding Type: Breast Fed Nipple Type: Slow - flow  LATCH Score                   Interventions    Lactation Tools Discussed/Used     Consult Status      Hardie PulleyBerkelhammer, Ruth Boschen 08/31/2018, 10:25 AM

## 2018-08-31 NOTE — Discharge Instructions (Signed)
NO SEX UNTIL AFTER YOU GET YOUR BIRTH CONTROL  ° ° °Postpartum Care After Vaginal Delivery °The period of time right after you deliver your newborn is called the postpartum period. °What kind of medical care will I receive? °· You may continue to receive fluids and medicines through an IV tube inserted into one of your veins. °· If an incision was made near your vagina (episiotomy) or if you had some vaginal tearing during delivery, cold compresses may be placed on your episiotomy or your tear. This helps to reduce pain and swelling. °· You may be given a squirt bottle to use when you go to the bathroom. You may use this until you are comfortable wiping as usual. To use the squirt bottle, follow these steps: °? Before you urinate, fill the squirt bottle with warm water. Do not use hot water. °? After you urinate, while you are sitting on the toilet, use the squirt bottle to rinse the area around your urethra and vaginal opening. This rinses away any urine and blood. °? You may do this instead of wiping. As you start healing, you may use the squirt bottle before wiping yourself. Make sure to wipe gently. °? Fill the squirt bottle with clean water every time you use the bathroom. °· You will be given sanitary pads to wear. °How can I expect to feel? °· You may not feel the need to urinate for several hours after delivery. °· You will have some soreness and pain in your abdomen and vagina. °· If you are breastfeeding, you may have uterine contractions every time you breastfeed for up to several weeks postpartum. Uterine contractions help your uterus return to its normal size. °· It is normal to have vaginal bleeding (lochia) after delivery. The amount and appearance of lochia is often similar to a menstrual period in the first week after delivery. It will gradually decrease over the next few weeks to a dry, yellow-brown discharge. For most women, lochia stops completely by 6-8 weeks after delivery. Vaginal bleeding can  vary from woman to woman. °· Within the first few days after delivery, you may have breast engorgement. This is when your breasts feel heavy, full, and uncomfortable. Your breasts may also throb and feel hard, tightly stretched, warm, and tender. After this occurs, you may have milk leaking from your breasts. Your health care provider can help you relieve discomfort due to breast engorgement. Breast engorgement should go away within a few days. °· You may feel more sad or worried than normal due to hormonal changes after delivery. These feelings should not last more than a few days. If these feelings do not go away after several days, speak with your health care provider. °How should I care for myself? °· Tell your health care provider if you have pain or discomfort. °· Drink enough water to keep your urine clear or pale yellow. °· Wash your hands thoroughly with soap and water for at least 20 seconds after changing your sanitary pads, after using the toilet, and before holding or feeding your baby. °· If you are not breastfeeding, avoid touching your breasts a lot. Doing this can make your breasts produce more milk. °· If you become weak or lightheaded, or you feel like you might faint, ask for help before: °? Getting out of bed. °? Showering. °· Change your sanitary pads frequently. Watch for any changes in your flow, such as a sudden increase in volume, a change in color, the passing of large blood   clots. If you pass a blood clot from your vagina, save it to show to your health care provider. Do not flush blood clots down the toilet without having your health care provider look at them. °· Make sure that all your vaccinations are up to date. This can help protect you and your baby from getting certain diseases. You may need to have immunizations done before you leave the hospital. °· If desired, talk with your health care provider about methods of family planning or birth control (contraception). °How can I start  bonding with my baby? °Spending as much time as possible with your baby is very important. During this time, you and your baby can get to know each other and develop a bond. Having your baby stay with you in your room (rooming in) can give you time to get to know your baby. Rooming in can also help you become comfortable caring for your baby. Breastfeeding can also help you bond with your baby. °How can I plan for returning home with my baby? °· Make sure that you have a car seat installed in your vehicle. °? Your car seat should be checked by a certified car seat installer to make sure that it is installed safely. °? Make sure that your baby fits into the car seat safely. °· Ask your health care provider any questions you have about caring for yourself or your baby. Make sure that you are able to contact your health care provider with any questions after leaving the hospital. °This information is not intended to replace advice given to you by your health care provider. Make sure you discuss any questions you have with your health care provider. °Document Released: 07/26/2007 Document Revised: 03/02/2016 Document Reviewed: 09/02/2015 °Elsevier Interactive Patient Education © 2018 [employer] ° ° °Breastfeeding °Choosing to breastfeed is one of the best decisions you can make for yourself and your baby. A change in hormones during pregnancy causes your breasts to make breast milk in your milk-producing glands. Hormones prevent breast milk from being released before your baby is born. They also prompt milk flow after birth. Once breastfeeding has begun, thoughts of your baby, as well as his or her sucking or crying, can stimulate the release of milk from your milk-producing glands. °Benefits of breastfeeding °Research shows that breastfeeding offers many health benefits for infants and mothers. It also offers a cost-free and convenient way to feed your baby. °For your baby °· Your first milk (colostrum) helps your  baby's digestive system to function better. °· Special cells in your milk (antibodies) help your baby to fight off infections. °· Breastfed babies are less likely to develop asthma, allergies, obesity, or type 2 diabetes. They are also at lower risk for sudden infant death syndrome (SIDS). °· Nutrients in breast milk are better able to meet your baby’s needs compared to infant formula. °· Breast milk improves your baby's brain development. °For you °· Breastfeeding helps to create a very special bond between you and your baby. °· Breastfeeding is convenient. Breast milk costs nothing and is always available at the correct temperature. °· Breastfeeding helps to burn calories. It helps you to lose the weight that you gained during pregnancy. °· Breastfeeding makes your uterus return faster to its size before pregnancy. It also slows bleeding (lochia) after you give birth. °· Breastfeeding helps to lower your risk of developing type 2 diabetes, osteoporosis, rheumatoid arthritis, cardiovascular disease, and breast, ovarian, uterine, and endometrial cancer later in life. °Breastfeeding basics °  Starting breastfeeding °· Find a comfortable place to sit or lie down, with your neck and back well-supported. °· Place a pillow or a rolled-up blanket under your baby to bring him or her to the level of your breast (if you are seated). Nursing pillows are specially designed to help support your arms and your baby while you breastfeed. °· Make sure that your baby's tummy (abdomen) is facing your abdomen. °· Gently massage your breast. With your fingertips, massage from the outer edges of your breast inward toward the nipple. This encourages milk flow. If your milk flows slowly, you may need to continue this action during the feeding. °· Support your breast with 4 fingers underneath and your thumb above your nipple (make the letter "C" with your hand). Make sure your fingers are well away from your nipple and your baby’s  mouth. °· Stroke your baby's lips gently with your finger or nipple. °· When your baby's mouth is open wide enough, quickly bring your baby to your breast, placing your entire nipple and as much of the areola as possible into your baby's mouth. The areola is the colored area around your nipple. °? More areola should be visible above your baby's upper lip than below the lower lip. °? Your baby's lips should be opened and extended outward (flanged) to ensure an adequate, comfortable latch. °? Your baby's tongue should be between his or her lower gum and your breast. °· Make sure that your baby's mouth is correctly positioned around your nipple (latched). Your baby's lips should create a seal on your breast and be turned out (everted). °· It is common for your baby to suck about 2-3 minutes in order to start the flow of breast milk. °Latching °Teaching your baby how to latch onto your breast properly is very important. An improper latch can cause nipple pain, decreased milk supply, and poor weight gain in your baby. Also, if your baby is not latched onto your nipple properly, he or she may swallow some air during feeding. This can make your baby fussy. Burping your baby when you switch breasts during the feeding can help to get rid of the air. However, teaching your baby to latch on properly is still the best way to prevent fussiness from swallowing air while breastfeeding. °Signs that your baby has successfully latched onto your nipple °· Silent tugging or silent sucking, without causing you pain. Infant's lips should be extended outward (flanged). °· Swallowing heard between every 3-4 sucks once your milk has started to flow (after your let-down milk reflex occurs). °· Muscle movement above and in front of his or her ears while sucking. ° °Signs that your baby has not successfully latched onto your nipple °· Sucking sounds or smacking sounds from your baby while breastfeeding. °· Nipple pain. ° °If you think your  baby has not latched on correctly, slip your finger into the corner of your baby’s mouth to break the suction and place it between your baby's gums. Attempt to start breastfeeding again. °Signs of successful breastfeeding °Signs from your baby °· Your baby will gradually decrease the number of sucks or will completely stop sucking. °· Your baby will fall asleep. °· Your baby's body will relax. °· Your baby will retain a small amount of milk in his or her mouth. °· Your baby will let go of your breast by himself or herself. ° °Signs from you °· Breasts that have increased in firmness, weight, and size 1-3 hours after feeding. °·   Breasts that are softer immediately after breastfeeding. °· Increased milk volume, as well as a change in milk consistency and color by the fifth day of breastfeeding. °· Nipples that are not sore, cracked, or bleeding. ° °Signs that your baby is getting enough milk °· Wetting at least 1-2 diapers during the first 24 hours after birth. °· Wetting at least 5-6 diapers every 24 hours for the first week after birth. The urine should be clear or pale yellow by the age of 5 days. °· Wetting 6-8 diapers every 24 hours as your baby continues to grow and develop. °· At least 3 stools in a 24-hour period by the age of 5 days. The stool should be soft and yellow. °· At least 3 stools in a 24-hour period by the age of 7 days. The stool should be seedy and yellow. °· No loss of weight greater than 10% of birth weight during the first 3 days of life. °· Average weight gain of 4-7 oz (113-198 g) per week after the age of 4 days. °· Consistent daily weight gain by the age of 5 days, without weight loss after the age of 2 weeks. °After a feeding, your baby may spit up a small amount of milk. This is normal. °Breastfeeding frequency and duration °Frequent feeding will help you make more milk and can prevent sore nipples and extremely full breasts (breast engorgement). Breastfeed when you feel the need to  reduce the fullness of your breasts or when your baby shows signs of hunger. This is called "breastfeeding on demand." Signs that your baby is hungry include: °· Increased alertness, activity, or restlessness. °· Movement of the head from side to side. °· Opening of the mouth when the corner of the mouth or cheek is stroked (rooting). °· Increased sucking sounds, smacking lips, cooing, sighing, or squeaking. °· Hand-to-mouth movements and sucking on fingers or hands. °· Fussing or crying. ° °Avoid introducing a pacifier to your baby in the first 4-6 weeks after your baby is born. After this time, you may choose to use a pacifier. Research has shown that pacifier use during the first year of a baby's life decreases the risk of sudden infant death syndrome (SIDS). °Allow your baby to feed on each breast as long as he or she wants. When your baby unlatches or falls asleep while feeding from the first breast, offer the second breast. Because newborns are often sleepy in the first few weeks of life, you may need to awaken your baby to get him or her to feed. °Breastfeeding times will vary from baby to baby. However, the following rules can serve as a guide to help you make sure that your baby is properly fed: °· Newborns (babies 4 weeks of age or younger) may breastfeed every 1-3 hours. °· Newborns should not go without breastfeeding for longer than 3 hours during the day or 5 hours during the night. °· You should breastfeed your baby a minimum of 8 times in a 24-hour period. ° °Breast milk pumping °Pumping and storing breast milk allows you to make sure that your baby is exclusively fed your breast milk, even at times when you are unable to breastfeed. This is especially important if you go back to work while you are still breastfeeding, or if you are not able to be present during feedings. Your lactation consultant can help you find a method of pumping that works best for you and give you guidelines about how long it  is   safe to store breast milk. °Caring for your breasts while you breastfeed °Nipples can become dry, cracked, and sore while breastfeeding. The following recommendations can help keep your breasts moisturized and healthy: °· Avoid using soap on your nipples. °· Wear a supportive bra designed especially for nursing. Avoid wearing underwire-style bras or extremely tight bras (sports bras). °· Air-dry your nipples for 3-4 minutes after each feeding. °· Use only cotton bra pads to absorb leaked breast milk. Leaking of breast milk between feedings is normal. °· Use lanolin on your nipples after breastfeeding. Lanolin helps to maintain your skin's normal moisture barrier. Pure lanolin is not harmful (not toxic) to your baby. You may also hand express a few drops of breast milk and gently massage that milk into your nipples and allow the milk to air-dry. ° °In the first few weeks after giving birth, some women experience breast engorgement. Engorgement can make your breasts feel heavy, warm, and tender to the touch. Engorgement peaks within 3-5 days after you give birth. The following recommendations can help to ease engorgement: °· Completely empty your breasts while breastfeeding or pumping. You may want to start by applying warm, moist heat (in the shower or with warm, water-soaked hand towels) just before feeding or pumping. This increases circulation and helps the milk flow. If your baby does not completely empty your breasts while breastfeeding, pump any extra milk after he or she is finished. °· Apply ice packs to your breasts immediately after breastfeeding or pumping, unless this is too uncomfortable for you. To do this: °? Put ice in a plastic bag. °? Place a towel between your skin and the bag. °? Leave the ice on for 20 minutes, 2-3 times a day. °· Make sure that your baby is latched on and positioned properly while breastfeeding. ° °If engorgement persists after 48 hours of following these recommendations,  contact your health care provider or a lactation consultant. °Overall health care recommendations while breastfeeding °· Eat 3 healthy meals and 3 snacks every day. Well-nourished mothers who are breastfeeding need an additional 450-500 calories a day. You can meet this requirement by increasing the amount of a balanced diet that you eat. °· Drink enough water to keep your urine pale yellow or clear. °· Rest often, relax, and continue to take your prenatal vitamins to prevent fatigue, stress, and low vitamin and mineral levels in your body (nutrient deficiencies). °· Do not use any products that contain nicotine or tobacco, such as cigarettes and e-cigarettes. Your baby may be harmed by chemicals from cigarettes that pass into breast milk and exposure to secondhand smoke. If you need help quitting, ask your health care provider. °· Avoid alcohol. °· Do not use illegal drugs or marijuana. °· Talk with your health care provider before taking any medicines. These include over-the-counter and prescription medicines as well as vitamins and herbal supplements. Some medicines that may be harmful to your baby can pass through breast milk. °· It is possible to become pregnant while breastfeeding. If birth control is desired, ask your health care provider about options that will be safe while breastfeeding your baby. °Where to find more information: °[employer]: www.llli.org °Contact a health care provider if: °· You feel like you want to stop breastfeeding or have become frustrated with breastfeeding. °· Your nipples are cracked or bleeding. °· Your breasts are red, tender, or warm. °· You have: °? Painful breasts or nipples. °? A swollen area on either breast. °? A fever   or chills. °? Nausea or vomiting. °? Drainage other than breast milk from your nipples. °· Your breasts do not become full before feedings by the fifth day after you give birth. °· You feel sad and depressed. °· Your baby is: °? Too  sleepy to eat well. °? Having trouble sleeping. °? More than 1 week old and wetting fewer than 6 diapers in a 24-hour period. °? Not gaining weight by 5 days of age. °· Your baby has fewer than 3 stools in a 24-hour period. °· Your baby's skin or the white parts of his or her eyes become yellow. °Get help right away if: °· Your baby is overly tired (lethargic) and does not want to wake up and feed. °· Your baby develops an unexplained fever. °Summary °· Breastfeeding offers many health benefits for infant and mothers. °· Try to breastfeed your infant when he or she shows early signs of hunger. °· Gently tickle or stroke your baby's lips with your finger or nipple to allow the baby to open his or her mouth. Bring the baby to your breast. Make sure that much of the areola is in your baby's mouth. Offer one side and burp the baby before you offer the other side. °· Talk with your health care provider or lactation consultant if you have questions or you face problems as you breastfeed. °This information is not intended to replace advice given to you by your health care provider. Make sure you discuss any questions you have with your health care provider. °Document Released: 09/28/2005 Document Revised: 10/30/2016 Document Reviewed: 10/30/2016 °Elsevier Interactive Patient Education © 2018 [employer] Inc. ° ° °

## 2018-08-31 NOTE — Discharge Summary (Addendum)
Postpartum Discharge Summary     Patient Name: Carol Hunt DOB: 1991-08-03 MRN: [id number]  Date of admission: 08/29/2018 Delivering Provider: Seabron Spates   Date of discharge: 08/31/2018  Admitting diagnosis: 2WKS INDUCTION  Intrauterine pregnancy: [redacted]w[redacted]d    Secondary diagnosis:  Principal Problem:   Gestational diabetes mellitus (GDM) in third trimester controlled on oral hypoglycemic drug Active Problems:   Anemia  Additional problems: none     Discharge diagnosis: Term Pregnancy Delivered                                                                                                Post partum procedures:lac repair  Augmentation: Cytotec and Foley Balloon  Complications: None  Hospital course:  Onset of Labor With Vaginal Delivery     27y.o. yo G1P1001 at 337w0das admitted in Latent Labor on 08/29/2018. Patient had an uncomplicated labor course as follows:  Membrane Rupture Time/Date: 8:00 PM ,08/29/2018   Intrapartum Procedures: Episiotomy: None [1]                                         Lacerations:  2nd degree [3]  Patient had a delivery of a Viable infant. 08/29/2018  Information for the patient's newborn:  CoWinston, [name][[id number]]Delivery Method: Vag-Spont    Pateint had an uncomplicated postpartum course.  She is ambulating, tolerating a regular diet, passing flatus, and urinating well. Patient is discharged home in stable condition on 08/31/18.  Magnesium Sulfate recieved: No BMZ received: No  Physical exam  Vitals:   08/30/18 0537 08/30/18 1000 08/30/18 1323 08/31/18 0651  BP: 107/60 108/62 111/65 120/69  Pulse: (!) 107 97 80 70  Resp: '18 16 17 18  ' Temp: 99.3 F (37.4 C) 97.8 F (36.6 C) 98.1 F (36.7 C) 98.4 F (36.9 C)  TempSrc: Oral Oral Oral Oral  SpO2:  99%    Weight:      Height:       General: alert Lochia: appropriate Uterine Fundus: firm Incision: N/A DVT Evaluation: No evidence of DVT seen on physical  exam. Labs: Lab Results  Component Value Date   WBC 10.4 08/29/2018   HGB 11.1 (L) 08/29/2018   HCT 34.3 (L) 08/29/2018   MCV 90.7 08/29/2018   PLT 244 08/29/2018   CMP Latest Ref Rng & Units 01/07/2018  Glucose 65 - 99 mg/dL 127(H)  BUN 6 - 20 mg/dL 8  Creatinine 0.44 - 1.00 mg/dL 0.57  Sodium 135 - 145 mmol/L 138  Potassium 3.5 - 5.1 mmol/L 3.5  Chloride 101 - 111 mmol/L 102  CO2 22 - 32 mmol/L 23  Calcium 8.9 - 10.3 mg/dL 9.0  Total Protein 6.5 - 8.1 g/dL 8.0  Total Bilirubin 0.3 - 1.2 mg/dL 0.8  Alkaline Phos 38 - 126 U/L 63  AST 15 - 41 U/L 55(H)  ALT 14 - 54 U/L 65(H)    Discharge instruction: per After Visit Summary and "Baby and Me Booklet".  After visit meds:  Allergies as of 08/31/2018   No Known Allergies     Medication List    TAKE these medications   ferrous sulfate 325 (65 FE) MG tablet Take 1 tablet (325 mg total) by mouth 2 (two) times daily with a meal.   ibuprofen 600 MG tablet Commonly known as:  ADVIL,MOTRIN Take 1 tablet (600 mg total) by mouth every 6 (six) hours.   measles, mumps & rubella vaccine injection Commonly known as:  MMR Inject 0.5 mLs into the skin once for 1 dose.   prenatal multivitamin Tabs tablet Take 1 tablet by mouth daily at 12 noon.   Tdap 5-2.5-18.5 LF-MCG/0.5 injection Commonly known as:  BOOSTRIX Inject 0.5 mLs into the muscle once for 1 dose.       Diet: routine diet  Activity: Advance as tolerated. Pelvic rest for 6 weeks.   Outpatient follow up:6 weeks Follow up Appt: Future Appointments  Date Time Provider [place]  10/10/2018  1:30 PM Carol Hunt, CNM [facility]   Follow up Visit:  Please schedule this patient for Postpartum visit in: post partem visit at 6 weeks For C/S patients schedule nurse incision check in weeks 2 weeks: no Low risk pregnancy complicated by: GDM Delivery mode:  SVD Anticipated Birth Control:  IUD PP Procedures needed: none Schedule Integrated BH  visit: no  Newborn Data: Live born female  Birth Weight: 7 lb 2.8 oz (3255 g) APGAR: 9, 9  Newborn Delivery   Birth date/time:  08/29/2018 23:03:00 Delivery type:  Vaginal, Spontaneous     Baby Feeding: both Disposition:home with mother   08/31/2018 Carol Osmond, DO  I spoke with and examined patient and agree with resident/PA-S/MS/SNM's note and plan of care.  Will need pp 2hr GTT d/t A2DM. Abstinence until IUD. Carol Hunt, CNM, [facility] 09/01/2018 11:06 AM

## 2018-09-21 DIAGNOSIS — Z029 Encounter for administrative examinations, unspecified: Secondary | ICD-10-CM

## 2018-10-10 ENCOUNTER — Ambulatory Visit (INDEPENDENT_AMBULATORY_CARE_PROVIDER_SITE_OTHER): Payer: Medicaid Other | Admitting: Women's Health

## 2018-10-10 ENCOUNTER — Encounter: Payer: Self-pay | Admitting: Women's Health

## 2018-10-10 DIAGNOSIS — Z8632 Personal history of gestational diabetes: Secondary | ICD-10-CM

## 2018-10-10 NOTE — Progress Notes (Signed)
   POSTPARTUM VISIT Patient name: Carol Hunt MRN [id number]  Date of birth: 1991-04-05 Chief Complaint:   Postpartum Care  History of Present Illness:   Carol BullaMagen D Mayeux is a 27 y.o. 871P1001 Caucasian female being seen today for a postpartum visit. She is 6 weeks postpartum following a spontaneous vaginal delivery at 39.0 gestational weeks after IOL for A2DM. Anesthesia: epidural. Laceration: 2nd degree. I have fully reviewed the prenatal and intrapartum course. Pregnancy complicated by A2DM. Postpartum course has been uncomplicated. Bleeding no bleeding. Bowel function is some constipation, bleeding. Bladder function is normal.  Patient is sexually active. Last sexual activity: 12/25.  Contraception method is condoms and wants IUD.  [place] Postpartum Depression Screening: negative. Score 0.   Last pap 05/04/18.  Results were normal .  No LMP recorded.  Baby's course has been uncomplicated. Baby is feeding by breast.  Review of Systems:   Pertinent items are noted in HPI Denies Abnormal vaginal discharge w/ itching/odor/irritation, headaches, visual changes, shortness of breath, chest pain, abdominal pain, severe nausea/vomiting, or problems with urination or bowel movements. Pertinent History Reviewed:  Reviewed past medical,surgical, obstetrical and family history.  Reviewed problem list, medications and allergies. OB History  Gravida Para Term Preterm AB Living  1 1 1     1   SAB TAB Ectopic Multiple Live Births        0 1    # Outcome Date GA Lbr Len/2nd Weight Sex Delivery Anes PTL Lv  1 Term 08/29/18 [id number] 00:50 / 00:03 7 lb 2.8 oz (3.255 kg) M Vag-Spont EPI  LIV   Physical Assessment:   Vitals:   10/10/18 1338  BP: 116/75  Pulse: 75  Weight: 207 lb 3.2 oz (94 kg)  Height: 5\' 3"  (1.6 m)  Body mass index is 36.7 kg/m.       Physical Examination:   General appearance: alert, well appearing, and in no distress  Mental status: alert, oriented to person, place, and  time  Skin: warm & dry   Cardiovascular: normal heart rate noted   Respiratory: normal respiratory effort, no distress   Breasts: deferred, no complaints   Abdomen: soft, non-tender   Pelvic: VULVA: normal appearing vulva with no masses, tenderness or lesions, UTERUS: uterus is normal size, shape, consistency and nontender  Rectal: no external hemorrhoids  Extremities: no edema       No results found for this or any previous visit (from the past 24 hour(s)).  Assessment & Plan:  1) Postpartum exam 2) 6 wks s/p SVB after IOL for A2DM 3) Breastfeeding 4) Depression screening 5) Contraception counseling, pt prefers abstinence until IUD insertion  6) A2DM during pregnancy> will get 12wk 2hr GTT 7) Constipation> gave printed prevention/relief measures   Meds: No orders of the defined types were placed in this encounter.   Follow-up: Return for 1/8 hcg am/IUD pm; then 4wks later for IUD f/u and 2hr sugar test.   No orders of the defined types were placed in this encounter.   Cheral MarkerKimberly R Chloe Bluett CNM, WHNP-BC 10/10/2018 2:00 PM

## 2018-10-10 NOTE — Patient Instructions (Signed)
NO SEX UNTIL AFTER YOU GET YOUR BIRTH CONTROL   Constipation Drink plenty of fluid, preferably water, throughout the day Eat foods high in fiber such as fruits, vegetables, and grains Exercise, such as walking, is a good way to keep your bowels regular Drink warm fluids, especially warm prune juice, or decaf coffee Eat a 1/2 cup of real oatmeal (not instant), 1/2 cup applesauce, and 1/2-1 cup warm prune juice every day If needed, you may take Colace (docusate sodium) stool softener once or twice a day to help keep the stool soft. If you are pregnant, wait until you are out of your first trimester (12-14 weeks of pregnancy) If you still are having problems with constipation, you may take Miralax once daily as needed to help keep your bowels regular.  If you are pregnant, wait until you are out of your first trimester (12-14 weeks of pregnancy)   

## 2018-10-25 ENCOUNTER — Other Ambulatory Visit: Payer: Medicaid Other

## 2018-10-25 ENCOUNTER — Ambulatory Visit (INDEPENDENT_AMBULATORY_CARE_PROVIDER_SITE_OTHER): Payer: Medicaid Other | Admitting: Women's Health

## 2018-10-25 ENCOUNTER — Encounter: Payer: Self-pay | Admitting: Women's Health

## 2018-10-25 VITALS — BP 122/66 | HR 83 | Ht 63.0 in | Wt 207.4 lb

## 2018-10-25 DIAGNOSIS — Z3200 Encounter for pregnancy test, result unknown: Secondary | ICD-10-CM

## 2018-10-25 DIAGNOSIS — Z30430 Encounter for insertion of intrauterine contraceptive device: Secondary | ICD-10-CM

## 2018-10-25 LAB — BETA HCG QUANT (REF LAB)

## 2018-10-25 MED ORDER — LEVONORGESTREL 19.5 MCG/DAY IU IUD
INTRAUTERINE_SYSTEM | Freq: Once | INTRAUTERINE | Status: AC
  Administered 2018-10-25: 1 via INTRAUTERINE

## 2018-10-25 NOTE — Progress Notes (Signed)
   IUD INSERTION Patient name: Carol Hunt MRN [id number]  Date of birth: Aug 31, 1991 Subjective Findings:   Carol Hunt is a 28 y.o. G63P1001 Caucasian female being seen today for insertion of a Liletta IUD.   Patient's last menstrual period was 10/16/2018. Last sexual intercourse was 12/25 Last pap7/24/19. Results were:  normal  The risks and benefits of the method and placement have been thouroughly reviewed with the patient and all questions were answered.  Specifically the patient is aware of failure rate of 10/998, expulsion of the IUD and of possible perforation.  The patient is aware of irregular bleeding due to the method and understands the incidence of irregular bleeding diminishes with time.  Signed copy of informed consent in chart.  Pertinent History Reviewed:   Reviewed past medical,surgical, social, obstetrical and family history.  Reviewed problem list, medications and allergies. Objective Findings & Procedure:   Vitals:   10/25/18 1608  BP: 122/66  Pulse: 83  Weight: 207 lb 6.4 oz (94.1 kg)  Height: 5\' 3"  (1.6 m)  Body mass index is 36.74 kg/m.  Results for orders placed or performed in visit on 10/25/18 (from the past 24 hour(s))  HCG, Tumor Marker   Collection Time: 10/25/18  8:52 AM  Result Value Ref Range   hCG Quant <1 mIU/mL     Time out was performed.  A graves speculum was placed in the vagina.  The cervix was visualized, prepped using Betadine, and grasped with a single tooth tenaculum. The uterus was found to be neutral and it sounded to 7 cm.  Liletta IUD placed per manufacturer's recommendations. The strings were trimmed to approximately 3 cm. The patient tolerated the procedure well.   Informal transabdominal sonogram w/ portable u/s was performed by amber (vaginal probe being cleaned) and the proper placement of the IUD was verified. Assessment & Plan:   1) Liletta IUD insertion The patient was given post procedure instructions, including  signs and symptoms of infection and to check for the strings after each menses or each month, and refraining from intercourse or anything in the vagina for 3 days. She was given a Liletta care card with date IUD placed, and date IUD to be removed. She is scheduled for a f/u appointment in 4 weeks.  2) H/O A2DM recent pregnancy> will do 2hr GTT @ next visit  No orders of the defined types were placed in this encounter.   Return in about 4 weeks (around 11/22/2018) for F/U and 2hr sugar test.  Cheral Marker CNM, [facility] 10/25/2018 4:57 PM

## 2018-10-25 NOTE — Patient Instructions (Addendum)
Nothing in vagina for 3 days (no sex, douching, tampons, etc...)  Check your strings once a month to make sure you can feel them, if you are not able to please let us know  If you develop a fever of 100.4 or more in the next few weeks, or if you develop severe abdominal pain, please let [place] know  Use a backup method of birth control, such as condoms, for 2 weeks   You will have your sugar test next visit.  Please do not eat or drink anything after midnight the night before you come, not even water.  You will be here for at least two hours.      Intrauterine Device Insertion, Care After  This sheet gives you information about how to care for yourself after your procedure. Your health care provider may also give you more specific instructions. If you have problems or questions, contact your health care provider. What can I expect after the procedure? After the procedure, it is common to have:  Cramps and pain in the abdomen.  Light bleeding (spotting) or heavier bleeding that is like your menstrual period. This may last for up to a few days.  Lower back pain.  Dizziness.  Headaches.  Nausea. Follow these instructions at home:  Before resuming sexual activity, check to make sure that you can feel the IUD string(s). You should be able to feel the end of the string(s) below the opening of your cervix. If your IUD string is in place, you may resume sexual activity. ? If you had a hormonal IUD inserted more than 7 days after your most recent period started, you will need to use a backup method of birth control for 7 days after IUD insertion. Ask your health care provider whether this applies to you.  Continue to check that the IUD is still in place by feeling for the string(s) after every menstrual period, or once a month.  Take over-the-counter and prescription medicines only as told by your health care provider.  Do not drive or use heavy machinery while taking prescription pain  medicine.  Keep all follow-up visits as told by your health care provider. This is important. Contact a health care provider if:  You have bleeding that is heavier or lasts longer than a normal menstrual cycle.  You have a fever.  You have cramps or abdominal pain that get worse or do not get better with medicine.  You develop abdominal pain that is new or is not in the same area of earlier cramping and pain.  You feel lightheaded or weak.  You have abnormal or bad-smelling discharge from your vagina.  You have pain during sexual activity.  You have any of the following problems with your IUD string(s): ? The string bothers or hurts you or your sexual partner. ? You cannot feel the string. ? The string has gotten longer.  You can feel the IUD in your vagina.  You think you may be pregnant, or you miss your menstrual period.  You think you may have an STI (sexually transmitted infection). Get help right away if:  You have flu-like symptoms.  You have a fever and chills.  You can feel that your IUD has slipped out of place. Summary  After the procedure, it is common to have cramps and pain in the abdomen. It is also common to have light bleeding (spotting) or heavier bleeding that is like your menstrual period.  Continue to check that the  IUD is still in place by feeling for the string(s) after every menstrual period, or once a month.  Keep all follow-up visits as told by your health care provider. This is important.  Contact your health care provider if you have problems with your IUD string(s), such as the string getting longer or bothering you or your sexual partner. This information is not intended to replace advice given to you by your health care provider. Make sure you discuss any questions you have with your health care provider. Document Released: 05/27/2011 Document Revised: 08/19/2016 Document Reviewed: 08/19/2016 Elsevier Interactive Patient Education  2019  [employer]  Levonorgestrel intrauterine device (IUD) What is this medicine? LEVONORGESTREL IUD (LEE voe nor jes trel) is a contraceptive (birth control) device. The device is placed inside the uterus by a healthcare professional. It is used to prevent pregnancy. This device can also be used to treat heavy bleeding that occurs during your period. This medicine may be used for other purposes; ask your health care provider or pharmacist if you have questions. COMMON BRAND NAME(S): Cameron AliKyleena, LILETTA, Mirena, Skyla What should I tell my health care provider before I take this medicine? They need to know if you have any of these conditions: -abnormal Pap smear -cancer of the breast, uterus, or cervix -diabetes -endometritis -genital or pelvic infection now or in the past -have more than one sexual partner or your partner has more than one partner -heart disease -history of an ectopic or tubal pregnancy -immune system problems -IUD in place -liver disease or tumor -problems with blood clots or take blood-thinners -seizures -use intravenous drugs -uterus of unusual shape -vaginal bleeding that has not been explained -an unusual or allergic reaction to levonorgestrel, other hormones, silicone, or polyethylene, medicines, foods, dyes, or preservatives -pregnant or trying to get pregnant -breast-feeding How should I use this medicine? This device is placed inside the uterus by a health care professional. Talk to your pediatrician regarding the use of this medicine in children. Special care may be needed. Overdosage: If you think you have taken too much of this medicine contact a poison control center or emergency room at once. NOTE: This medicine is only for you. Do not share this medicine with others. What if I miss a dose? This does not apply. Depending on the brand of device you have inserted, the device will need to be replaced every 3 to 5 years if you wish to continue using this type  of birth control. What may interact with this medicine? Do not take this medicine with any of the following medications: -amprenavir -bosentan -fosamprenavir This medicine may also interact with the following medications: -aprepitant -armodafinil -barbiturate medicines for inducing sleep or treating seizures -bexarotene -boceprevir -griseofulvin -medicines to treat seizures like carbamazepine, ethotoin, felbamate, oxcarbazepine, phenytoin, topiramate -modafinil -pioglitazone -rifabutin -rifampin -rifapentine -some medicines to treat HIV infection like atazanavir, efavirenz, indinavir, lopinavir, nelfinavir, tipranavir, ritonavir -St. John's wort -warfarin This list may not describe all possible interactions. Give your health care provider a list of all the medicines, herbs, non-prescription drugs, or dietary supplements you use. Also tell them if you smoke, drink alcohol, or use illegal drugs. Some items may interact with your medicine. What should I watch for while using this medicine? Visit your doctor or health care professional for regular check ups. See your doctor if you or your partner has sexual contact with others, becomes HIV positive, or gets a sexual transmitted disease. This product does not protect you against HIV infection (AIDS) or  other sexually transmitted diseases. You can check the placement of the IUD yourself by reaching up to the top of your vagina with clean fingers to feel the threads. Do not pull on the threads. It is a good habit to check placement after each menstrual period. Call your doctor right away if you feel more of the IUD than just the threads or if you cannot feel the threads at all. The IUD may come out by itself. You may become pregnant if the device comes out. If you notice that the IUD has come out use a backup birth control method like condoms and call your health care provider. Using tampons will not change the position of the IUD and are okay  to use during your period. This IUD can be safely scanned with magnetic resonance imaging (MRI) only under specific conditions. Before you have an MRI, tell your healthcare provider that you have an IUD in place, and which type of IUD you have in place. What side effects may I notice from receiving this medicine? Side effects that you should report to your doctor or health care professional as soon as possible: -allergic reactions like skin rash, itching or hives, swelling of the face, lips, or tongue -fever, flu-like symptoms -genital sores -high blood pressure -no menstrual period for 6 weeks during use -pain, swelling, warmth in the leg -pelvic pain or tenderness -severe or sudden headache -signs of pregnancy -stomach cramping -sudden shortness of breath -trouble with balance, talking, or walking -unusual vaginal bleeding, discharge -yellowing of the eyes or skin Side effects that usually do not require medical attention (report to your doctor or health care professional if they continue or are bothersome): -acne -breast pain -change in sex drive or performance -changes in weight -cramping, dizziness, or faintness while the device is being inserted -headache -irregular menstrual bleeding within first 3 to 6 months of use -nausea This list may not describe all possible side effects. Call your doctor for medical advice about side effects. You may report side effects to FDA at 1-800-FDA-1088. Where should I keep my medicine? This does not apply. NOTE: This sheet is a summary. It may not cover all possible information. If you have questions about this medicine, talk to your doctor, pharmacist, or health care provider.  2019 Elsevier/Gold Standard (2016-07-10 14:14:56)

## 2018-11-24 ENCOUNTER — Ambulatory Visit: Payer: Medicaid Other | Admitting: Advanced Practice Midwife

## 2018-11-24 ENCOUNTER — Other Ambulatory Visit: Payer: Medicaid Other

## 2018-12-01 ENCOUNTER — Ambulatory Visit: Payer: Self-pay | Admitting: Advanced Practice Midwife

## 2018-12-01 ENCOUNTER — Other Ambulatory Visit: Payer: Self-pay

## 2018-12-12 NOTE — Telephone Encounter (Signed)
Note sent to nurse. 

## 2019-02-01 IMAGING — US US OB < 14 WEEKS - US OB TV
1 series · 14 of 28 positions shown · non-contrast
Comparison: None.

CLINICAL DATA: First trimester pregnancy. Abdominal pain and
nausea. Positive pregnancy test.

EXAM:
OBSTETRIC <14 WK US AND TRANSVAGINAL OB US
TECHNIQUE: Both transabdominal and transvaginal ultrasound examinations were
performed for complete evaluation of the gestation as well as the
maternal uterus, adnexal regions, and pelvic cul-de-sac.
Transvaginal technique was performed to assess early pregnancy.

[Series 1: us ob < 14 weeks - us ob tv · 0.28mm/px · 14 of 44 slices shown]
[im 2/44]
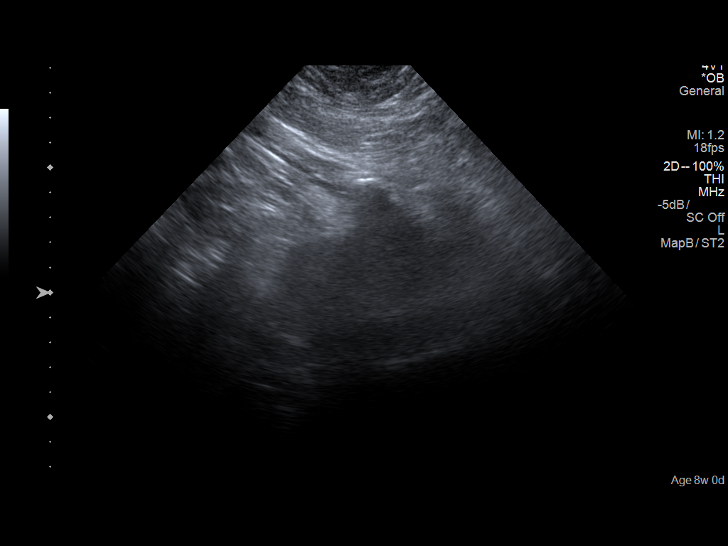
[im 5/44]
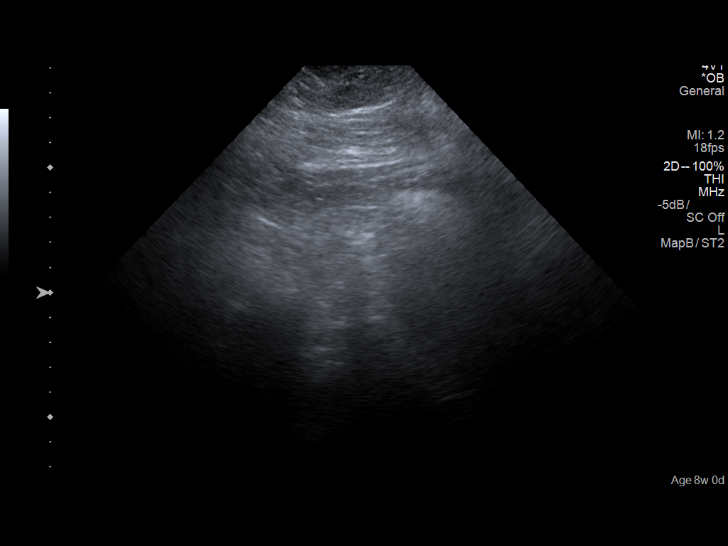
[im 8/44]
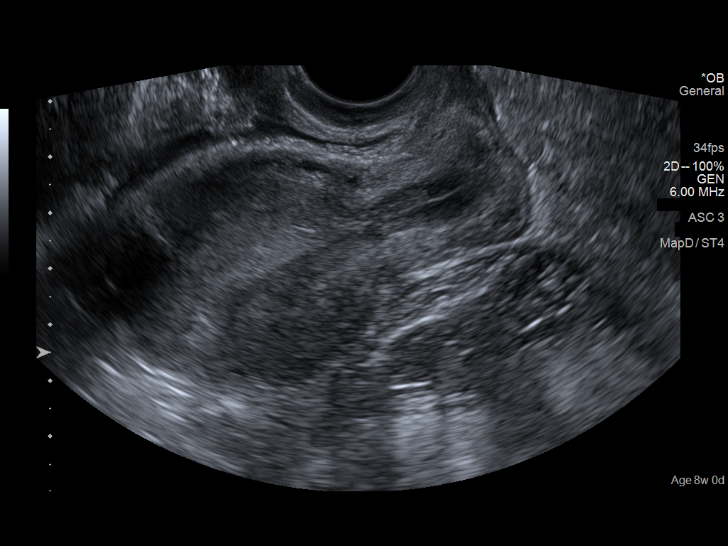
[im 12/44]
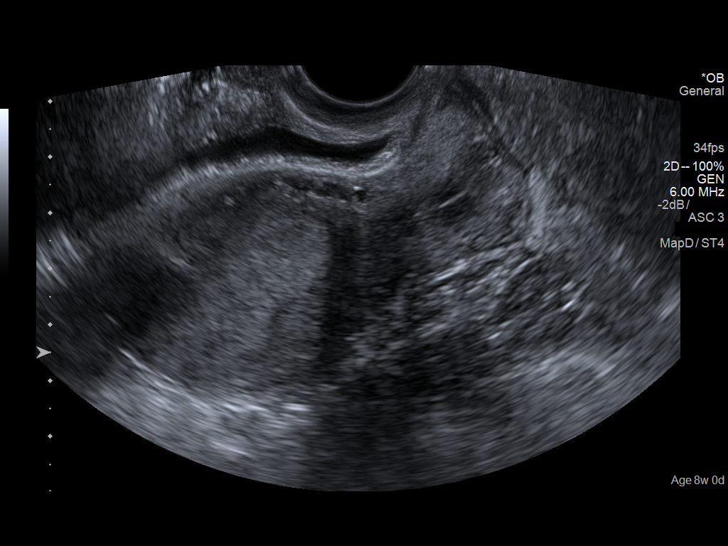
[im 15/44]
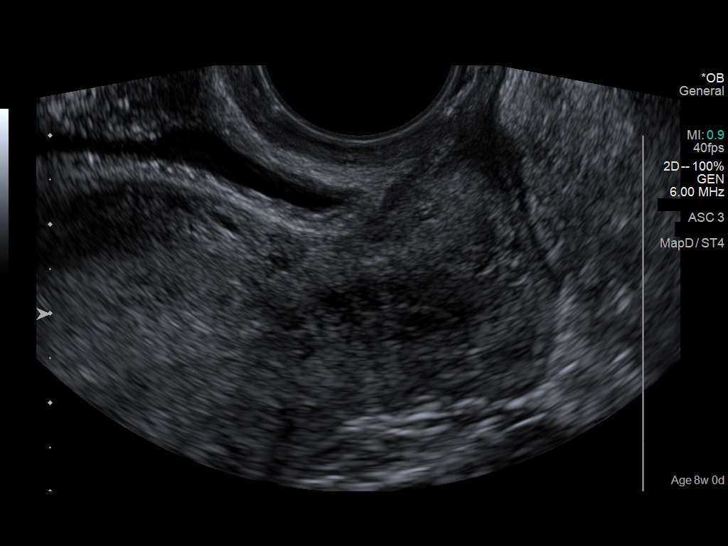
[im 18/44]
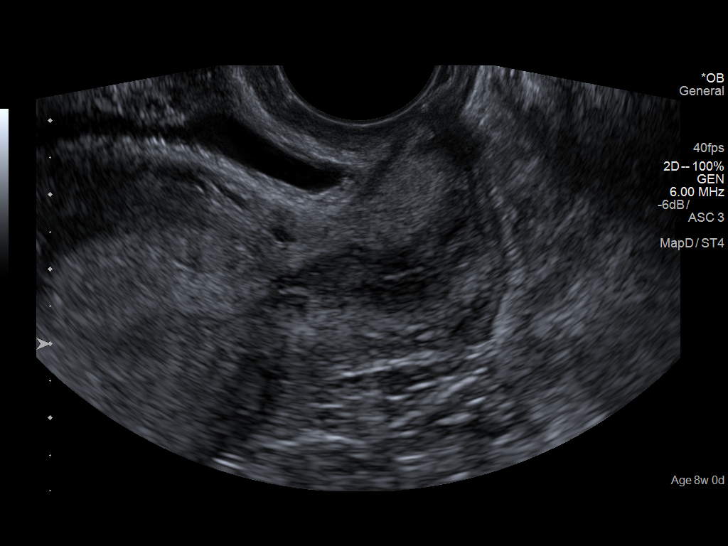
[im 21/44]
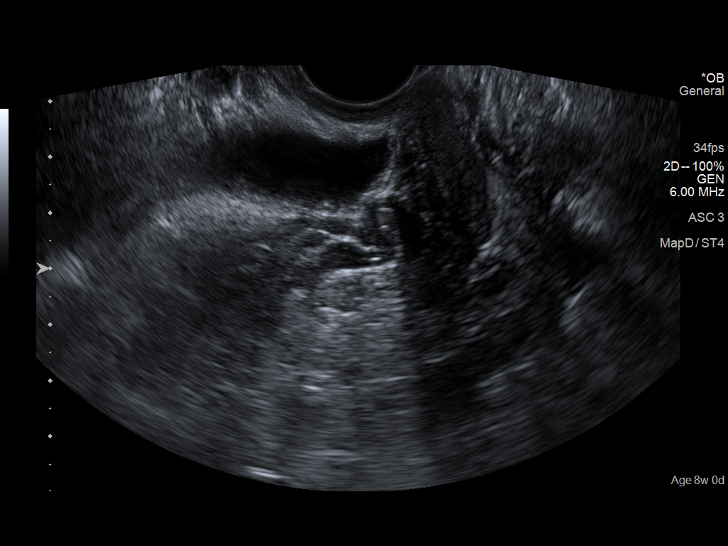
[im 24/44]
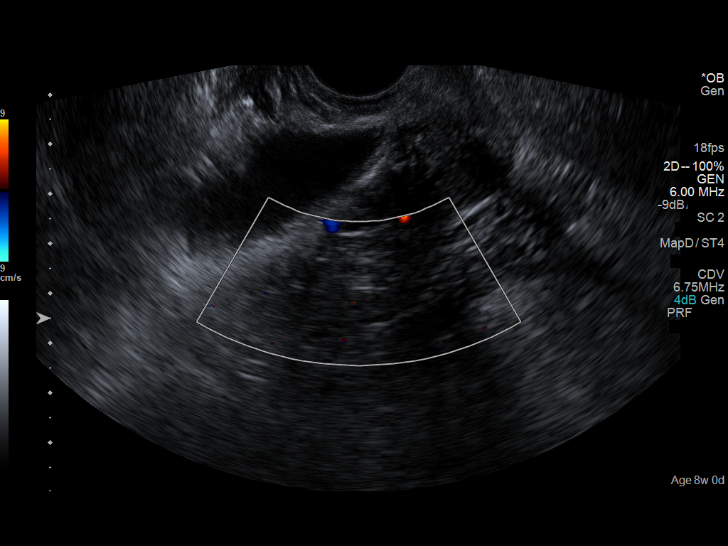
[im 28/44]
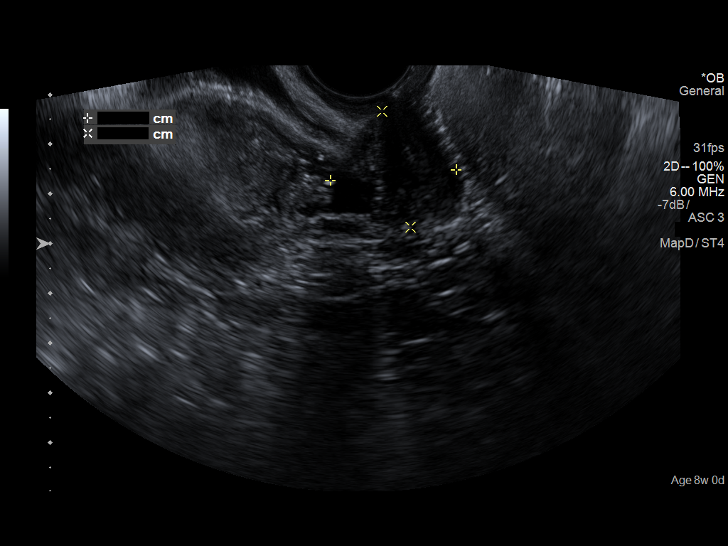
[im 31/44]
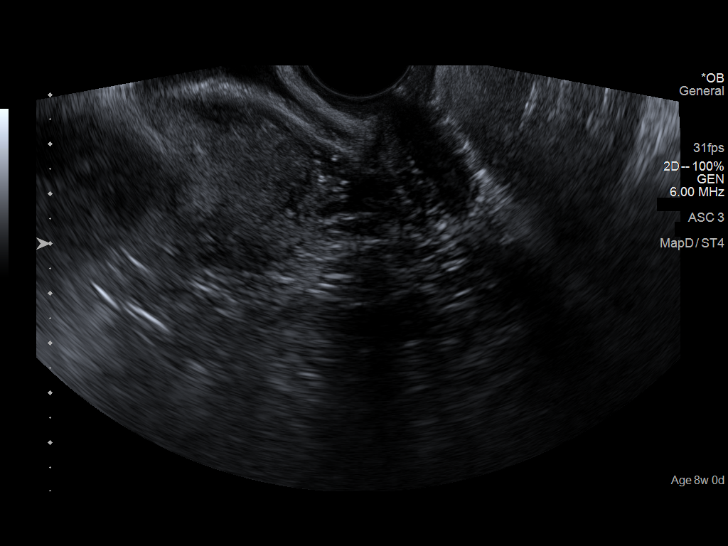
[im 34/44]
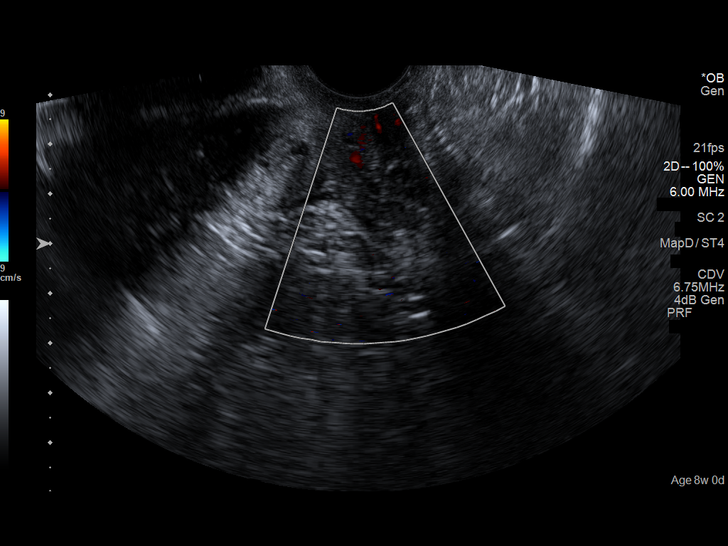
[im 37/44]
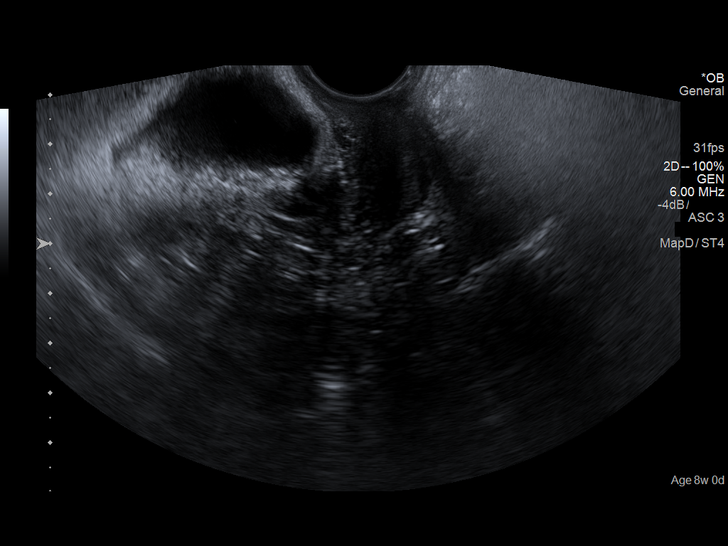
[im 40/44]
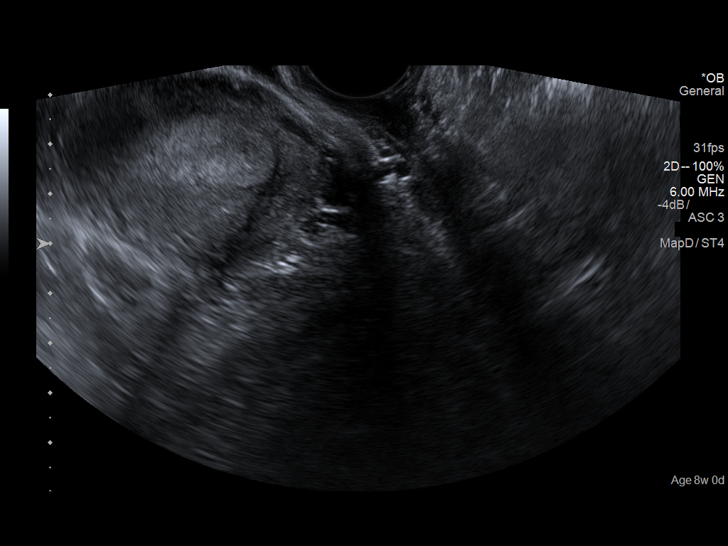
[im 44/44]
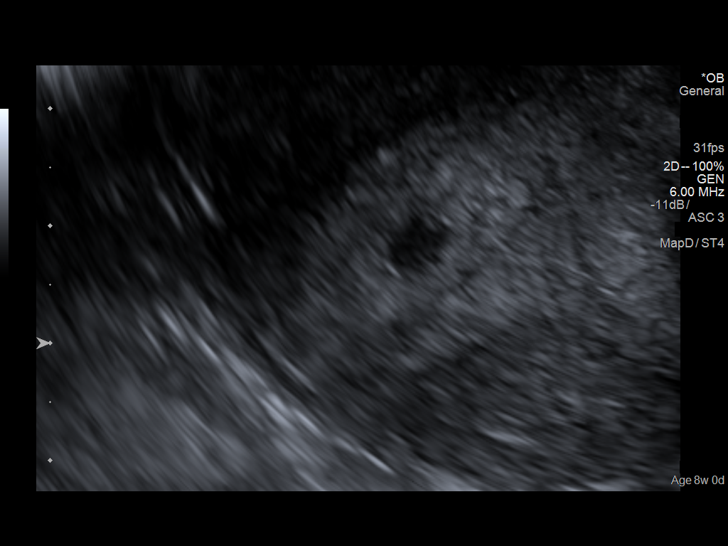

[14 of 28 positions shown; findings below may reference images not displayed]

FINDINGS: Intrauterine gestational sac: Single. Asymmetric to the right within
the fundus of the uterus.

Yolk sac:  Not visualized

Embryo:  Not visualized

Cardiac Activity: Not visualized

Heart Rate:   bpm

MSD: 4.8 mm   5 w   2 d

Subchorionic hemorrhage:  None visualized.

Maternal uterus/adnexae: Uterus and adnexa are otherwise within
normal limits.
IMPRESSION: 1. Gestational sac within the fundus of the uterus measuring 4.8 mm
mean sac diameter. This correlates with an estimated gestational age
of 5 weeks and 2 days.
2. No yolk sac or fetal pole visualized.

## 2019-10-17 ENCOUNTER — Other Ambulatory Visit: Payer: Self-pay

## 2019-10-17 ENCOUNTER — Ambulatory Visit: Payer: Medicaid Other | Attending: Internal Medicine

## 2019-10-17 DIAGNOSIS — Z20822 Contact with and (suspected) exposure to covid-19: Secondary | ICD-10-CM

## 2019-10-19 LAB — NOVEL CORONAVIRUS, NAA: SARS-CoV-2, NAA: NOT DETECTED

## 2021-06-05 ENCOUNTER — Ambulatory Visit: Payer: Medicaid Other | Admitting: Nurse Practitioner

## 2021-09-05 ENCOUNTER — Telehealth: Payer: Medicaid Other | Admitting: Nurse Practitioner

## 2021-09-05 ENCOUNTER — Encounter: Payer: Self-pay | Admitting: Nurse Practitioner

## 2021-09-05 DIAGNOSIS — J069 Acute upper respiratory infection, unspecified: Secondary | ICD-10-CM

## 2021-09-05 MED ORDER — BENZONATATE 100 MG PO CAPS: 100.0000 mg | ORAL_CAPSULE | Freq: Three times a day (TID) | ORAL | 0 refills | Status: AC | PRN

## 2021-09-05 NOTE — Progress Notes (Signed)
Virtual Visit Consent   Carol Hunt, you are scheduled for a virtual visit with a [place] provider today.     Just as with appointments in the office, your consent must be obtained to participate.  Your consent will be active for this visit and any virtual visit you may have with one of our providers in the next 365 days.     If you have a MyChart account, a copy of this consent can be sent to you electronically.  All virtual visits are billed to your insurance company just like a traditional visit in the office.    As this is a virtual visit, video technology does not allow for your provider to perform a traditional examination.  This may limit your provider's ability to fully assess your condition.  If your provider identifies any concerns that need to be evaluated in person or the need to arrange testing (such as labs, EKG, etc.), we will make arrangements to do so.     Although advances in technology are sophisticated, we cannot ensure that it will always work on either your end or our end.  If the connection with a video visit is poor, the visit may have to be switched to a telephone visit.  With either a video or telephone visit, we are not always able to ensure that we have a secure connection.     I need to obtain your verbal consent now.   Are you willing to proceed with your visit today?    Carol Hunt has provided verbal consent on 09/05/2021 for a virtual visit (video or telephone).   Viviano Simas, FNP   Date: 09/05/2021 4:58 PM   Virtual Visit via Video Note   I, Viviano Simas, connected with  Carol Hunt  ([id number], 21-Oct-1990) on 09/05/21 at  5:00 PM EST by a video-enabled telemedicine application and verified that I am speaking with the correct person using two identifiers.  Location: Patient: Virtual Visit Location Patient: Home Provider: Virtual Visit Location Provider: Home Office   I discussed the limitations of evaluation and management by  telemedicine and the availability of in person appointments. The patient expressed understanding and agreed to proceed.    History of Present Illness: Carol Hunt is a 30 y.o. who identifies as a female who was assigned female at birth, and is being seen today with complaints of fatigue that started two days ago, yesterday she started to have nasal congestion. She has been sneezing and she has a very sore throat that burns when she coughs. Her cough is 90% dry.   She denies a fever.   She has had COVID in the past   She has not had a flu vaccine.  All of her children has the flu two weeks ago.   Problems:  Patient Active Problem List   Diagnosis Date Noted   Encounter for IUD insertion 10/25/2018   History of gestational diabetes 06/07/2018   History of chlamydia 02/14/2018   Foreign body of forearm, superficial 12/05/2012   Rectal bleeding 01/21/2011   Anemia 01/21/2011    Allergies: No Known Allergies Medications:  Current Outpatient Medications:    ferrous sulfate 325 (65 FE) MG tablet, Take 1 tablet (325 mg total) by mouth 2 (two) times daily with a meal. (Patient not taking: Reported on 10/10/2018), Disp: 60 tablet, Rfl: 3   Prenatal Vit-Fe Fumarate-FA (PRENATAL MULTIVITAMIN) TABS tablet, Take 1 tablet by mouth daily at 12 noon., Disp: , Rfl:  Observations/Objective: Patient is well-developed, well-nourished in no acute distress.  Resting comfortably at home.  Head is normocephalic, atraumatic.  No labored breathing.  Speech is clear and coherent with logical content.  Patient is alert and oriented at baseline.    Assessment and Plan: 1. Viral upper respiratory tract infection  - benzonatate (TESSALON) 100 MG capsule; Take 1 capsule (100 mg total) by mouth 3 (three) times daily as needed for up to 10 days for cough.  Dispense: 30 capsule; Refill: 0    Manage URI symptoms with OTC decongestant and ibuprofen as directed  Follow Up Instructions: I discussed the  assessment and treatment plan with the patient. The patient was provided an opportunity to ask questions and all were answered. The patient agreed with the plan and demonstrated an understanding of the instructions.  A copy of instructions were sent to the patient via MyChart unless otherwise noted below.    The patient was advised to call back or seek an in-person evaluation if the symptoms worsen or if the condition fails to improve as anticipated.  Time:  I spent 10 minutes with the patient via telehealth technology discussing the above problems/concerns.    Viviano Simas, FNP

## 2021-09-30 ENCOUNTER — Other Ambulatory Visit: Payer: Self-pay

## 2021-09-30 ENCOUNTER — Ambulatory Visit
Admission: EM | Admit: 2021-09-30 | Discharge: 2021-09-30 | Disposition: A | Discharge status: Home / Self Care | Payer: 59 | Attending: Student | Admitting: Student

## 2021-09-30 DIAGNOSIS — R0989 Other specified symptoms and signs involving the circulatory and respiratory systems: Secondary | ICD-10-CM

## 2021-09-30 DIAGNOSIS — R509 Fever, unspecified: Secondary | ICD-10-CM | POA: Diagnosis not present

## 2021-09-30 DIAGNOSIS — R000 Tachycardia, unspecified: Secondary | ICD-10-CM

## 2021-09-30 DIAGNOSIS — M791 Myalgia, unspecified site: Secondary | ICD-10-CM

## 2021-09-30 DIAGNOSIS — R0981 Nasal congestion: Secondary | ICD-10-CM | POA: Diagnosis not present

## 2021-09-30 DIAGNOSIS — R059 Cough, unspecified: Secondary | ICD-10-CM

## 2021-09-30 LAB — POCT RAPID STREP A (OFFICE): Rapid Strep A Screen: NEGATIVE

## 2021-09-30 MED ORDER — OSELTAMIVIR PHOSPHATE 75 MG PO CAPS: 75.0000 mg | ORAL_CAPSULE | Freq: Two times a day (BID) | ORAL | 0 refills | Status: DC

## 2021-09-30 MED ORDER — ONDANSETRON 8 MG PO TBDP: 8.0000 mg | ORAL_TABLET | Freq: Three times a day (TID) | ORAL | 0 refills | Status: DC | PRN

## 2021-09-30 MED ORDER — PROMETHAZINE-DM 6.25-15 MG/5ML PO SYRP: 5.0000 mL | ORAL_SOLUTION | Freq: Four times a day (QID) | ORAL | 0 refills | Status: DC | PRN

## 2021-09-30 NOTE — Discharge Instructions (Addendum)
-  Tamiflu twice daily x5 days. This medication can cause nausea, so I also sent nausea medication. You can stop the Tamiflu if you don't like it or if it causes side effects.  -Take the Zofran (ondansetron) up to 3 times daily for nausea and vomiting. -Promethazine DM cough syrup for congestion/cough. This could make you drowsy, so take at night before bed. -Mucinex during the day -For fevers/chills, bodyaches, headaches- You can take Tylenol up to 1000 mg 3 times daily, and ibuprofen up to 600 mg 3 times daily with food.  You can take these together, or alternate every 3-4 hours. -Drink plenty of water/gatorade and get plenty of rest -With a virus, you're typically contagious for 5-7 days, or as long as you're having fevers.  -Come back and see [place] if things are getting worse instead of better, like shortness of breath, chest pain, fevers and chills that are getting higher instead of lower and do not come down with Tylenol or ibuprofen, etc.

## 2021-09-30 NOTE — ED Triage Notes (Signed)
Pt reports sore throat, cough, headache,  fever 101.4 F,  body aches x 1 day.

## 2021-09-30 NOTE — ED Provider Notes (Signed)
[facility]    CSN: [id number] Arrival date & time: 09/30/21  0913      History   Chief Complaint Chief Complaint  Patient presents with   Fever   Sore Throat    HPI Carol Hunt is a 30 y.o. female presenting with viral syndrome x1 day. Medical history IUD contraception. Denies history cardiopulm ds.  Describes symptoms for 1 day.  Describes sore throat, cough, headache, fevers of 101.4, severe myalgias.  Has attempted over-the-counter medication without relief.  Denies history of cardiopulmonary disease.  Denies known sick exposure.  Denies shortness of breath, chest pain, dizziness.  HPI  Past Medical History:  Diagnosis Date   Gestational diabetes    metformin and glyburide   Medical history non-contributory    Pregnant     Patient Active Problem List   Diagnosis Date Noted   Encounter for IUD insertion 10/25/2018   History of gestational diabetes 06/07/2018   History of chlamydia 02/14/2018   Foreign body of forearm, superficial 12/05/2012   Rectal bleeding 01/21/2011   Anemia 01/21/2011    Past Surgical History:  Procedure Laterality Date   MIDDLE EAR SURGERY     TYMPANOPLASTY      OB History     Gravida  1   Para  1   Term  1   Preterm      AB      Living  1      SAB      IAB      Ectopic      Multiple  0   Live Births  1            Home Medications    Prior to Admission medications   Medication Sig Start Date End Date Taking? Authorizing Provider  ondansetron (ZOFRAN-ODT) 8 MG disintegrating tablet Take 1 tablet (8 mg total) by mouth every 8 (eight) hours as needed for nausea or vomiting. 09/30/21  Yes Rhys Martini, PA-C  oseltamivir (TAMIFLU) 75 MG capsule Take 1 capsule (75 mg total) by mouth every 12 (twelve) hours. 09/30/21  Yes Rhys Martini, PA-C  promethazine-dextromethorphan (PROMETHAZINE-DM) 6.25-15 MG/5ML syrup Take 5 mLs by mouth 4 (four) times daily as needed for cough. 09/30/21  Yes Rhys Martini, PA-C  ferrous sulfate 325 (65 FE) MG tablet Take 1 tablet (325 mg total) by mouth 2 (two) times daily with a meal. Patient not taking: Reported on 10/10/2018 02/10/18   Cheral Marker, CNM  Prenatal Vit-Fe Fumarate-FA (PRENATAL MULTIVITAMIN) TABS tablet Take 1 tablet by mouth daily at 12 noon.    [provider]    Family History Family History  Problem Relation Age of Onset   Pancreatitis Father        living   Diabetes Father    Diabetes Sister    Cancer Maternal Aunt        breast    Diabetes Paternal Aunt    COPD Paternal Grandfather    Colon cancer Neg Hx     Social History Social History   Tobacco Use   Smoking status: Former    Types: Cigarettes   Smokeless tobacco: Never  Vaping Use   Vaping Use: Never used  Substance Use Topics   Alcohol use: No   Drug use: No     Allergies   Patient has no known allergies.   Review of Systems Review of Systems  Constitutional:  Positive for chills and fever. Negative for appetite  change.  HENT:  Positive for congestion. Negative for ear pain, rhinorrhea, sinus pressure, sinus pain and sore throat.   Eyes:  Negative for redness and visual disturbance.  Respiratory:  Positive for cough. Negative for chest tightness, shortness of breath and wheezing.   Cardiovascular:  Negative for chest pain and palpitations.  Gastrointestinal:  Negative for abdominal pain, constipation, diarrhea, nausea and vomiting.  Genitourinary:  Negative for dysuria, frequency and urgency.  Musculoskeletal:  Positive for myalgias.  Neurological:  Negative for dizziness, weakness and headaches.  Psychiatric/Behavioral:  Negative for confusion.   All other systems reviewed and are negative.   Physical Exam Triage Vital Signs ED Triage Vitals  Enc Vitals Group     BP 09/30/21 1024 110/64     Pulse Rate 09/30/21 1024 (!) 120     Resp 09/30/21 1024 18     Temp 09/30/21 1024 (!) 103 F (39.4 C)     Temp Source 09/30/21 1024  Oral     SpO2 09/30/21 1024 95 %     Weight --      Height --      Head Circumference --      Peak Flow --      Pain Score 09/30/21 1023 10     Pain Loc --      Pain Edu? --      Excl. in [place]? --    No data found.  Updated Vital Signs BP 110/64 (BP Location: Right Arm)    Pulse (!) 120    Temp (!) 103 F (39.4 C) (Oral)    Resp 18    LMP  (Approximate)    SpO2 95%    Breastfeeding No   Visual Acuity Right Eye Distance:   Left Eye Distance:   Bilateral Distance:    Right Eye Near:   Left Eye Near:    Bilateral Near:     Physical Exam Vitals reviewed.  Constitutional:      General: She is not in acute distress.    Appearance: Normal appearance. She is ill-appearing.  HENT:     Head: Normocephalic and atraumatic.     Right Ear: Tympanic membrane, ear canal and external ear normal. No tenderness. No middle ear effusion. There is no impacted cerumen. Tympanic membrane is not perforated, erythematous, retracted or bulging.     Left Ear: Tympanic membrane, ear canal and external ear normal. No tenderness.  No middle ear effusion. There is no impacted cerumen. Tympanic membrane is not perforated, erythematous, retracted or bulging.     Nose: Nose normal. No congestion.     Mouth/Throat:     Mouth: Mucous membranes are moist.     Pharynx: Uvula midline. No oropharyngeal exudate or posterior oropharyngeal erythema.  Eyes:     Extraocular Movements: Extraocular movements intact.     Pupils: Pupils are equal, round, and reactive to light.  Cardiovascular:     Rate and Rhythm: Regular rhythm. Tachycardia present.     Heart sounds: Normal heart sounds.  Pulmonary:     Effort: Pulmonary effort is normal.     Breath sounds: Normal breath sounds. No decreased breath sounds, wheezing, rhonchi or rales.  Abdominal:     Palpations: Abdomen is soft.     Tenderness: There is no abdominal tenderness. There is no guarding or rebound.  Lymphadenopathy:     Cervical: No cervical adenopathy.      Right cervical: No superficial cervical adenopathy.    Left cervical: No superficial cervical  adenopathy.  Neurological:     General: No focal deficit present.     Mental Status: She is alert and oriented to person, place, and time.  Psychiatric:        Mood and Affect: Mood normal.        Behavior: Behavior normal.        Thought Content: Thought content normal.        Judgment: Judgment normal.     UC Treatments / Results  Labs (all labs ordered are listed, but only abnormal results are displayed) Labs Reviewed  POCT RAPID STREP A (OFFICE)    EKG   Radiology No results found.  Procedures Procedures (including critical care time)  Medications Ordered in UC Medications - No data to display  Initial Impression / Assessment and Plan / UC Course  I have reviewed the triage vital signs and the nursing notes.  Pertinent labs & imaging results that were available during my care of the patient were reviewed by me and considered in my medical decision making (see chart for details).     This patient is a very pleasant 30 y.o. year old female presenting with suspected influenza.  Febrile at 103, tachycardic at 120.  Influenza and COVID PCR sent. Tamiflu sent.  Zofran sent to have on hand.  Promethazine DM cough syrup sent.  Continue over-the-counter medications.  ED return precautions discussed. Patient verbalizes understanding and agreement.   Coding Level 4 for acute illness with systemic symptoms, and prescription drug management   Final Clinical Impressions(s) / UC Diagnoses   Final diagnoses:  Suspected novel influenza A virus infection     Discharge Instructions      -Tamiflu twice daily x5 days. This medication can cause nausea, so I also sent nausea medication. You can stop the Tamiflu if you don't like it or if it causes side effects.  -Take the Zofran (ondansetron) up to 3 times daily for nausea and vomiting. -Promethazine DM cough syrup for  congestion/cough. This could make you drowsy, so take at night before bed. -Mucinex during the day -For fevers/chills, bodyaches, headaches- You can take Tylenol up to 1000 mg 3 times daily, and ibuprofen up to 600 mg 3 times daily with food.  You can take these together, or alternate every 3-4 hours. -Drink plenty of water/gatorade and get plenty of rest -With a virus, you're typically contagious for 5-7 days, or as long as you're having fevers.  -Come back and see [place] if things are getting worse instead of better, like shortness of breath, chest pain, fevers and chills that are getting higher instead of lower and do not come down with Tylenol or ibuprofen, etc.    ED Prescriptions     Medication Sig Dispense Auth. Provider   oseltamivir (TAMIFLU) 75 MG capsule Take 1 capsule (75 mg total) by mouth every 12 (twelve) hours. 10 capsule Marin Roberts E, PA-C   ondansetron (ZOFRAN-ODT) 8 MG disintegrating tablet Take 1 tablet (8 mg total) by mouth every 8 (eight) hours as needed for nausea or vomiting. 20 tablet Hazel Sams, PA-C   promethazine-dextromethorphan (PROMETHAZINE-DM) 6.25-15 MG/5ML syrup Take 5 mLs by mouth 4 (four) times daily as needed for cough. 118 mL Hazel Sams, PA-C      PDMP not reviewed this encounter.   Hazel Sams, PA-C 09/30/21 1140

## 2021-10-05 ENCOUNTER — Encounter (HOSPITAL_COMMUNITY): Payer: Self-pay | Admitting: Emergency Medicine

## 2021-10-05 ENCOUNTER — Emergency Department (HOSPITAL_COMMUNITY)
Admission: EM | Admit: 2021-10-05 | Discharge: 2021-10-05 | Disposition: A | Discharge status: Home / Self Care | Payer: 59 | Attending: Emergency Medicine | Admitting: Emergency Medicine

## 2021-10-05 ENCOUNTER — Other Ambulatory Visit: Payer: Self-pay

## 2021-10-05 DIAGNOSIS — Z20822 Contact with and (suspected) exposure to covid-19: Secondary | ICD-10-CM | POA: Insufficient documentation

## 2021-10-05 DIAGNOSIS — J029 Acute pharyngitis, unspecified: Secondary | ICD-10-CM | POA: Diagnosis present

## 2021-10-05 DIAGNOSIS — Z87891 Personal history of nicotine dependence: Secondary | ICD-10-CM | POA: Insufficient documentation

## 2021-10-05 LAB — CBC WITH DIFFERENTIAL/PLATELET
Abs Immature Granulocytes: 0.62 10*3/uL — ABNORMAL HIGH (ref 0.00–0.07)
Basophils Absolute: 0.1 10*3/uL (ref 0.0–0.1)
Basophils Relative: 1 %
Eosinophils Absolute: 0.4 10*3/uL (ref 0.0–0.5)
Eosinophils Relative: 2 %
HCT: 32.3 % — ABNORMAL LOW (ref 36.0–46.0)
Hemoglobin: 10.6 g/dL — ABNORMAL LOW (ref 12.0–15.0)
Immature Granulocytes: 4 %
Lymphocytes Relative: 15 %
Lymphs Abs: 2.7 10*3/uL (ref 0.7–4.0)
MCH: 29.9 pg (ref 26.0–34.0)
MCHC: 32.8 g/dL (ref 30.0–36.0)
MCV: 91 fL (ref 80.0–100.0)
Monocytes Absolute: 0.9 10*3/uL (ref 0.1–1.0)
Monocytes Relative: 5 %
Neutro Abs: 13.1 10*3/uL — ABNORMAL HIGH (ref 1.7–7.7)
Neutrophils Relative %: 73 %
Platelets: 273 10*3/uL (ref 150–400)
RBC: 3.55 MIL/uL — ABNORMAL LOW (ref 3.87–5.11)
RDW: 13.6 % (ref 11.5–15.5)
WBC: 17.8 10*3/uL — ABNORMAL HIGH (ref 4.0–10.5)
nRBC: 0 % (ref 0.0–0.2)

## 2021-10-05 LAB — COMPREHENSIVE METABOLIC PANEL
ALT: 55 U/L — ABNORMAL HIGH (ref 0–44)
AST: 48 U/L — ABNORMAL HIGH (ref 15–41)
Albumin: 3.4 g/dL — ABNORMAL LOW (ref 3.5–5.0)
Alkaline Phosphatase: 98 U/L (ref 38–126)
Anion gap: 14 (ref 5–15)
BUN: 9 mg/dL (ref 6–20)
CO2: 24 mmol/L (ref 22–32)
Calcium: 8.8 mg/dL — ABNORMAL LOW (ref 8.9–10.3)
Chloride: 101 mmol/L (ref 98–111)
Creatinine, Ser: 0.66 mg/dL (ref 0.44–1.00)
GFR, Estimated: >60 mL/min (ref >60)
Glucose, Bld: 116 mg/dL — ABNORMAL HIGH (ref 70–99)
Potassium: 3.3 mmol/L — ABNORMAL LOW (ref 3.5–5.1)
Sodium: 139 mmol/L (ref 135–145)
Total Bilirubin: 0.7 mg/dL (ref 0.3–1.2)
Total Protein: 7.8 g/dL (ref 6.5–8.1)

## 2021-10-05 LAB — RESP PANEL BY RT-PCR (FLU A&B, COVID) ARPGX2
Influenza A by PCR: NEGATIVE
Influenza B by PCR: NEGATIVE
SARS Coronavirus 2 by RT PCR: NEGATIVE

## 2021-10-05 LAB — GROUP A STREP BY PCR: Group A Strep by PCR: DETECTED — AB

## 2021-10-05 LAB — MONONUCLEOSIS SCREEN: Mono Screen: NEGATIVE

## 2021-10-05 MED ORDER — ONDANSETRON HCL 4 MG/2ML IJ SOLN
4.0000 mg | Freq: Once | INTRAMUSCULAR | Status: DC
  Filled 2021-10-05: qty 2

## 2021-10-05 MED ORDER — SODIUM CHLORIDE 0.9 % IV BOLUS
1000.0000 mL | Freq: Once | INTRAVENOUS | Status: AC
  Administered 2021-10-05: 09:00:00 1000 mL via INTRAVENOUS

## 2021-10-05 MED ORDER — HYDROCODONE-ACETAMINOPHEN 5-325 MG PO TABS
1.0000 | ORAL_TABLET | Freq: Once | ORAL | Status: AC
  Administered 2021-10-05: 10:00:00 1 via ORAL
  Filled 2021-10-05: qty 1

## 2021-10-05 MED ORDER — PENICILLIN V POTASSIUM 500 MG PO TABS: 500.0000 mg | ORAL_TABLET | Freq: Four times a day (QID) | ORAL | 0 refills | Status: AC

## 2021-10-05 NOTE — ED Provider Notes (Signed)
[facility] Provider Note   CSN: [id number] Arrival date & time: 10/05/21  [id number]     History Chief Complaint  Patient presents with   Sore Throat    Carol Hunt is a 30 y.o. female.  Patient complains of a sore throat.  She was seen once before and checked for strep and flu which were negative.  She was treated with Tamiflu anyway  The history is provided by the patient and medical records. No language interpreter was used.  Sore Throat This is a recurrent problem. The current episode started more than 1 week ago. The problem occurs constantly. The problem has not changed since onset.Pertinent negatives include no chest pain, no abdominal pain and no headaches. Nothing aggravates the symptoms. Nothing relieves the symptoms. She has tried nothing for the symptoms.      Past Medical History:  Diagnosis Date   Gestational diabetes    metformin and glyburide   Medical history non-contributory    Pregnant     Patient Active Problem List   Diagnosis Date Noted   Encounter for IUD insertion 10/25/2018   History of gestational diabetes 06/07/2018   History of chlamydia 02/14/2018   Foreign body of forearm, superficial 12/05/2012   Rectal bleeding 01/21/2011   Anemia 01/21/2011    Past Surgical History:  Procedure Laterality Date   MIDDLE EAR SURGERY     TYMPANOPLASTY       OB History     Gravida  1   Para  1   Term  1   Preterm      AB      Living  1      SAB      IAB      Ectopic      Multiple  0   Live Births  1           Family History  Problem Relation Age of Onset   Pancreatitis Father        living   Diabetes Father    Diabetes Sister    Cancer Maternal Aunt        breast    Diabetes Paternal Aunt    COPD Paternal Grandfather    Colon cancer Neg Hx     Social History   Tobacco Use   Smoking status: Former    Types: Cigarettes   Smokeless tobacco: Never  Vaping Use   Vaping Use: Never used   Substance Use Topics   Alcohol use: No   Drug use: No    Home Medications Prior to Admission medications   Medication Sig Start Date End Date Taking? Authorizing Provider  oseltamivir (TAMIFLU) 75 MG capsule Take 1 capsule (75 mg total) by mouth every 12 (twelve) hours. 09/30/21  Yes Hazel Sams, PA-C  penicillin v potassium (VEETID) 500 MG tablet Take 1 tablet (500 mg total) by mouth 4 (four) times daily for 7 days. 10/05/21 10/12/21 Yes Milton Ferguson, MD  ferrous sulfate 325 (65 FE) MG tablet Take 1 tablet (325 mg total) by mouth 2 (two) times daily with a meal. Patient not taking: Reported on 10/10/2018 02/10/18   Roma Schanz, CNM  ondansetron (ZOFRAN-ODT) 8 MG disintegrating tablet Take 1 tablet (8 mg total) by mouth every 8 (eight) hours as needed for nausea or vomiting. Patient not taking: Reported on 10/05/2021 09/30/21   Hazel Sams, PA-C  promethazine-dextromethorphan (PROMETHAZINE-DM) 6.25-15 MG/5ML syrup Take 5 mLs by mouth 4 (four) times daily  as needed for cough. Patient not taking: Reported on 10/05/2021 09/30/21   Hazel Sams, PA-C    Allergies    Patient has no known allergies.  Review of Systems   Review of Systems  Constitutional:  Negative for appetite change and fatigue.  HENT:  Negative for congestion, ear discharge and sinus pressure.        Sore throat  Eyes:  Negative for discharge.  Respiratory:  Negative for cough.   Cardiovascular:  Negative for chest pain.  Gastrointestinal:  Negative for abdominal pain and diarrhea.  Genitourinary:  Negative for frequency and hematuria.  Musculoskeletal:  Negative for back pain.  Skin:  Negative for rash.  Neurological:  Negative for seizures and headaches.  Psychiatric/Behavioral:  Negative for hallucinations.    Physical Exam Updated Vital Signs BP 118/69    Pulse 94    Temp 97.8 F (36.6 C) (Oral)    Resp 16    Ht 5\' 4"  (1.626 m)    Wt 104.3 kg    SpO2 95%    BMI 39.48 kg/m   Physical  Exam Vitals and nursing note reviewed.  Constitutional:      Appearance: She is well-developed.  HENT:     Head: Normocephalic.     Comments: Pharynx mildly inflamed    Nose: Nose normal.  Eyes:     General: No scleral icterus.    Conjunctiva/sclera: Conjunctivae normal.  Neck:     Thyroid: No thyromegaly.  Cardiovascular:     Rate and Rhythm: Regular rhythm.     Heart sounds: No murmur heard.   No friction rub. No gallop.  Pulmonary:     Breath sounds: No stridor. No wheezing or rales.  Chest:     Chest wall: No tenderness.  Abdominal:     General: There is no distension.     Tenderness: There is no abdominal tenderness. There is no rebound.  Musculoskeletal:        General: Normal range of motion.     Cervical back: Neck supple.  Lymphadenopathy:     Cervical: No cervical adenopathy.  Skin:    Findings: No erythema or rash.  Neurological:     Mental Status: She is alert and oriented to person, place, and time.     Motor: No abnormal muscle tone.     Coordination: Coordination normal.  Psychiatric:        Behavior: Behavior normal.    ED Results / Procedures / Treatments   Labs (all labs ordered are listed, but only abnormal results are displayed) Labs Reviewed  GROUP A STREP BY PCR - Abnormal; Notable for the following components:      Result Value   Group A Strep by PCR DETECTED (*)    All other components within normal limits  CBC WITH DIFFERENTIAL/PLATELET - Abnormal; Notable for the following components:   WBC 17.8 (*)    RBC 3.55 (*)    Hemoglobin 10.6 (*)    HCT 32.3 (*)    Neutro Abs 13.1 (*)    Abs Immature Granulocytes 0.62 (*)    All other components within normal limits  COMPREHENSIVE METABOLIC PANEL - Abnormal; Notable for the following components:   Potassium 3.3 (*)    Glucose, Bld 116 (*)    Calcium 8.8 (*)    Albumin 3.4 (*)    AST 48 (*)    ALT 55 (*)    All other components within normal limits  RESP PANEL BY RT-PCR (FLU  A&B, COVID)  ARPGX2  MONONUCLEOSIS SCREEN    EKG None  Radiology No results found.  Procedures Procedures   Medications Ordered in ED Medications  ondansetron (ZOFRAN) injection 4 mg (4 mg Intravenous Not Given 10/05/21 0846)  sodium chloride 0.9 % bolus 1,000 mL (1,000 mLs Intravenous New Bag/Given 10/05/21 0847)  HYDROcodone-acetaminophen (NORCO/VICODIN) 5-325 MG per tablet 1 tablet (1 tablet Oral Given 10/05/21 1024)    ED Course  I have reviewed the triage vital signs and the nursing notes.  Pertinent labs & imaging results that were available during my care of the patient were reviewed by me and considered in my medical decision making (see chart for details).    MDM Rules/Calculators/A&P                         Patient with strep pharyngitis.  She will be given penicillin and follow-up as needed    Final Clinical Impression(s) / ED Diagnoses Final diagnoses:  Sore throat    Rx / DC Orders ED Discharge Orders          Ordered    penicillin v potassium (VEETID) 500 MG tablet  4 times daily        10/05/21 1101             Bethann Berkshire, MD 10/05/21 1105

## 2021-10-05 NOTE — Discharge Instructions (Signed)
Drink plenty of fluids.  Take Tylenol or Motrin for fever pain.  Return if problems

## 2021-10-05 NOTE — ED Triage Notes (Signed)
Pt reports sore throat, cough, headache and fever that started this past Monday for which she went to [facility] for. She was tested for strep which was negative and told she she had suspected influenza A, given tamiflu rx and sent home. Pt states starting last night her throat has become more sore and has difficulty swallowing. No fevers today.

## 2021-12-12 ENCOUNTER — Telehealth: Payer: Medicaid Other | Admitting: Physician Assistant

## 2021-12-12 DIAGNOSIS — H1032 Unspecified acute conjunctivitis, left eye: Secondary | ICD-10-CM

## 2021-12-12 MED ORDER — POLYMYXIN B-TRIMETHOPRIM 10000-0.1 UNIT/ML-% OP SOLN: 1.0000 [drp] | OPHTHALMIC | 0 refills | Status: AC

## 2021-12-12 NOTE — Progress Notes (Signed)

## 2022-02-26 ENCOUNTER — Ambulatory Visit (INDEPENDENT_AMBULATORY_CARE_PROVIDER_SITE_OTHER): Payer: 59 | Admitting: Family Medicine

## 2022-02-26 ENCOUNTER — Encounter: Payer: Self-pay | Admitting: Family Medicine

## 2022-02-26 VITALS — BP 124/62 | HR 64 | Ht 64.0 in | Wt 224.0 lb

## 2022-02-26 DIAGNOSIS — E559 Vitamin D deficiency, unspecified: Secondary | ICD-10-CM | POA: Diagnosis not present

## 2022-02-26 DIAGNOSIS — Z1159 Encounter for screening for other viral diseases: Secondary | ICD-10-CM | POA: Diagnosis not present

## 2022-02-26 DIAGNOSIS — R7301 Impaired fasting glucose: Secondary | ICD-10-CM | POA: Diagnosis not present

## 2022-02-26 DIAGNOSIS — I781 Nevus, non-neoplastic: Secondary | ICD-10-CM

## 2022-02-26 DIAGNOSIS — J069 Acute upper respiratory infection, unspecified: Secondary | ICD-10-CM

## 2022-02-26 DIAGNOSIS — Z8632 Personal history of gestational diabetes: Secondary | ICD-10-CM

## 2022-02-26 NOTE — Assessment & Plan Note (Signed)
-  encouraged symptomatic and supportive treatments with otc mucinex and robitussin dm

## 2022-02-26 NOTE — Assessment & Plan Note (Addendum)
-   benign -informed the patient that treatments include laser and intense pulsed light (IPL) - inform the patient that wearing makeup can help hide the marks

## 2022-02-26 NOTE — Patient Instructions (Addendum)
I appreciate the opportunity to provide care to you today!    Follow up:  3 months  Labs: please stop by the lab today to get your blood drawn (CBC, CMP, TSH, Lipid profile, HgA1c, Vit D)  Screening: Hep C  Cough: please take OTC : dextromethorphan dm ( Robitussin)     Please continue to a heart-healthy diet and increase your physical activities. Try to exercise for at least three times a week.      It was a pleasure to see you and I look forward to continuing to work together on your health and well-being. Please do not hesitate to call the office if you need care or have questions about your care.   Have a wonderful day and week. With Gratitude, Gilmore Laroche MSN, FNP-BC

## 2022-02-26 NOTE — Progress Notes (Signed)
New Patient Office Visit  Subjective:  Patient ID: Carol Hunt, female    DOB: 01-12-1991  Age: 31 y.o. MRN: [id number]  CC:  Chief Complaint  Patient presents with   New Patient (Initial Visit)    Establishing care, hasnt had a PCP before. Pt complains of sinus infection since 02/24/2022.     HPI Carol Hunt is a 31 y.o. female with past medical history of anemia present for establishing care. She complains of sinus pressure, cough, runny nose, and congestion. Symptoms onset was on 02/24/22. She has been taking OTC mucinex with moderate relief.  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    Past Medical History:  Diagnosis Date   Gestational diabetes    metformin and glyburide   Medical history non-contributory    Pregnant     Past Surgical History:  Procedure Laterality Date   MIDDLE EAR SURGERY     TYMPANOPLASTY      Family History  Problem Relation Age of Onset   Pancreatitis Father        living   Diabetes Father    Diabetes Sister    Cancer Maternal Aunt        breast    Diabetes Paternal Aunt    COPD Paternal Grandfather    Colon cancer Neg Hx     Social History   Socioeconomic History   Marital status: Single    Spouse name: Not on file   Number of children: 1   Years of education: Not on file   Highest education level: Not on file  Occupational History   Not on file  Tobacco Use   Smoking status: Every Day    Types: E-cigarettes    Passive exposure: Never   Smokeless tobacco: Never  Vaping Use   Vaping Use: Never used  Substance and Sexual Activity   Alcohol use: No   Drug use: No   Sexual activity: Yes    Birth control/protection: None  Other Topics Concern   Not on file  Social History Narrative   Not on file   Social Determinants of Health   Financial Resource Strain: Not on file  Food Insecurity: Not on file  Transportation Needs: Not on file  Physical  Activity: Not on file  Stress: Not on file  Social Connections: Not on file  Intimate Partner Violence: Not on file    ROS Review of Systems  Constitutional:  Negative for appetite change, chills, fatigue and fever.  HENT:  Positive for congestion, rhinorrhea and sinus pressure. Negative for sinus pain, sore throat and trouble swallowing.   Eyes:  Negative for photophobia, pain, itching and visual disturbance.  Respiratory:  Positive for cough. Negative for choking, chest tightness and shortness of breath.   Cardiovascular:  Negative for chest pain and palpitations.  Gastrointestinal:  Negative for constipation, diarrhea, nausea and vomiting.  Endocrine: Negative for polydipsia, polyphagia and polyuria.  Genitourinary:  Negative for difficulty urinating, dysuria, frequency and urgency.  Musculoskeletal:  Negative for back pain and neck pain.  Skin:  Negative for rash and wound.  Neurological:  Negative for dizziness, tremors, weakness, light-headedness and headaches.  Psychiatric/Behavioral:  Negative for confusion, self-injury, sleep disturbance and suicidal ideas.    Objective:   Today's Vitals: BP 124/62   Pulse 64   Ht '5\' 4"'  (1.626 m)   Wt 224 lb (101.6 kg)   LMP 02/25/2022  SpO2 98%   BMI 38.45 kg/m   Physical Exam Constitutional:      Appearance: She is obese.  HENT:     Head: Normocephalic.     Right Ear: External ear normal.     Left Ear: External ear normal.     Nose: Congestion and rhinorrhea present.     Mouth/Throat:     Mouth: Mucous membranes are moist.  Eyes:     Extraocular Movements: Extraocular movements intact.  Cardiovascular:     Rate and Rhythm: Normal rate and regular rhythm.     Pulses: Normal pulses.     Heart sounds: Normal heart sounds.  Pulmonary:     Effort: Pulmonary effort is normal.     Breath sounds: Normal breath sounds.  Abdominal:     Palpations: Abdomen is soft.  Musculoskeletal:     Cervical back: No rigidity.     Right  lower leg: No edema.     Left lower leg: No edema.  Skin:    General: Skin is warm.     Findings: Lesion present.     Comments: Dilated small blood vessels noted on the lower legs  Neurological:     Mental Status: She is alert and oriented to person, place, and time.  Psychiatric:     Comments: Normal affect    Assessment & Plan:   Problem List Items Addressed This Visit       Cardiovascular and Mediastinum   Telangiectasia of extremity - Primary    - benign -informed the patient that treatments include laser and intense pulsed light (IPL) - inform the patient that wearing makeup can help hide the marks           Respiratory   Acute URI    -encouraged symptomatic and supportive treatments with otc mucinex and robitussin dm         Other   History of gestational diabetes   Relevant Orders   CBC with Differential/Platelet   CMP14+EGFR   Lipid panel   TSH + free T4   Other Visit Diagnoses     IFG (impaired fasting glucose)       Relevant Orders   Hemoglobin A1c   Vitamin D deficiency       Relevant Orders   Vitamin D (25 hydroxy)   Need for hepatitis C screening test       Relevant Orders   Hepatitis C antibody       Outpatient Encounter Medications as of 02/26/2022  Medication Sig   trimethoprim-polymyxin b (POLYTRIM) ophthalmic solution Place 1 drop into the left eye every 4 (four) hours.   No facility-administered encounter medications on file as of 02/26/2022.    Follow-up: Return in about 3 months (around 05/29/2022) for pap smear.   Alvira Monday, FNP

## 2022-02-27 ENCOUNTER — Other Ambulatory Visit: Payer: Self-pay | Admitting: Family Medicine

## 2022-02-27 DIAGNOSIS — E559 Vitamin D deficiency, unspecified: Secondary | ICD-10-CM

## 2022-02-27 LAB — CBC WITH DIFFERENTIAL/PLATELET
Basophils Absolute: 0.1 10*3/uL (ref 0.0–0.2)
Basos: 1 %
EOS (ABSOLUTE): 0.3 10*3/uL (ref 0.0–0.4)
Eos: 5 %
Hematocrit: 35 % (ref 34.0–46.6)
Hemoglobin: 11.4 g/dL (ref 11.1–15.9)
Immature Grans (Abs): 0 10*3/uL (ref 0.0–0.1)
Immature Granulocytes: 0 %
Lymphocytes Absolute: 2.7 10*3/uL (ref 0.7–3.1)
Lymphs: 37 %
MCH: 28.4 pg (ref 26.6–33.0)
MCHC: 32.6 g/dL (ref 31.5–35.7)
MCV: 87 fL (ref 79–97)
Monocytes Absolute: 0.3 10*3/uL (ref 0.1–0.9)
Monocytes: 5 %
Neutrophils Absolute: 3.8 10*3/uL (ref 1.4–7.0)
Neutrophils: 52 %
Platelets: 283 10*3/uL (ref 150–450)
RBC: 4.01 x10E6/uL (ref 3.77–5.28)
RDW: 12.9 % (ref 11.7–15.4)
WBC: 7.3 10*3/uL (ref 3.4–10.8)

## 2022-02-27 LAB — HEPATITIS C ANTIBODY: Hep C Virus Ab: NONREACTIVE

## 2022-02-27 LAB — CMP14+EGFR
ALT: 42 IU/L — ABNORMAL HIGH (ref 0–32)
AST: 36 IU/L (ref 0–40)
Albumin/Globulin Ratio: 1.5 (ref 1.2–2.2)
Albumin: 4.5 g/dL (ref 3.9–5.0)
Alkaline Phosphatase: 79 IU/L (ref 44–121)
BUN/Creatinine Ratio: 10 (ref 9–23)
BUN: 8 mg/dL (ref 6–20)
Bilirubin Total: 0.3 mg/dL (ref 0.0–1.2)
CO2: 22 mmol/L (ref 20–29)
Calcium: 9.5 mg/dL (ref 8.7–10.2)
Chloride: 103 mmol/L (ref 96–106)
Creatinine, Ser: 0.77 mg/dL (ref 0.57–1.00)
Globulin, Total: 3.1 g/dL (ref 1.5–4.5)
Glucose: 102 mg/dL — ABNORMAL HIGH (ref 70–99)
Potassium: 4.1 mmol/L (ref 3.5–5.2)
Sodium: 140 mmol/L (ref 134–144)
Total Protein: 7.6 g/dL (ref 6.0–8.5)
eGFR: 107 mL/min/{1.73_m2} (ref >59)

## 2022-02-27 LAB — TSH+FREE T4
Free T4: 0.91 ng/dL (ref 0.82–1.77)
TSH: 1.37 u[IU]/mL (ref 0.450–4.500)

## 2022-02-27 LAB — LIPID PANEL
Chol/HDL Ratio: 6.1 ratio — ABNORMAL HIGH (ref 0.0–4.4)
Cholesterol, Total: 214 mg/dL — ABNORMAL HIGH (ref 100–199)
HDL: 35 mg/dL — ABNORMAL LOW (ref >39)
LDL Chol Calc (NIH): 152 mg/dL — ABNORMAL HIGH (ref 0–99)
Triglycerides: 146 mg/dL (ref 0–149)
VLDL Cholesterol Cal: 27 mg/dL (ref 5–40)

## 2022-02-27 LAB — VITAMIN D 25 HYDROXY (VIT D DEFICIENCY, FRACTURES): Vit D, 25-Hydroxy: 18.6 ng/mL — ABNORMAL LOW (ref 30.0–100.0)

## 2022-02-27 LAB — HEMOGLOBIN A1C
Est. average glucose Bld gHb Est-mCnc: 126 mg/dL
Hgb A1c MFr Bld: 6 % — ABNORMAL HIGH (ref 4.8–5.6)

## 2022-02-27 MED ORDER — VITAMIN D (ERGOCALCIFEROL) 1.25 MG (50000 UNIT) PO CAPS: 50000.0000 [IU] | ORAL_CAPSULE | ORAL | 1 refills | Status: DC

## 2022-02-27 NOTE — Progress Notes (Signed)
Please inform the patient that labs show that she has pre-diabetes and her cholesterol is elevated. I will recommend lifestyle changes and I will reassess her labs at her next visit. If her cholesterol levels are still elevated at her next visit, then I will start her on statin therapy.   Her vit D levels are low, and I've sent a prescription for once weekly Vit D supplement. She can pick up the prescription at her pharmacy.  Lifestyle changes include weight loss and exercising at least 3 days a week for 150 min weekly. I also recommend a cholesterol-lowering diet low in fat or saturated fat and implementing the Mediterranean diet, which emphasizes fruits, vegetables, whole grains, beans, nuts, seeds, and healthy fats.   All other labs are within normal limits.

## 2022-05-28 ENCOUNTER — Encounter: Payer: 59 | Admitting: Family Medicine

## 2022-06-02 ENCOUNTER — Encounter: Payer: 59 | Admitting: Family Medicine

## 2022-06-09 ENCOUNTER — Other Ambulatory Visit (HOSPITAL_COMMUNITY)
Admission: RE | Admit: 2022-06-09 | Discharge: 2022-06-09 | Disposition: A | Discharge status: Home / Self Care | Payer: 59 | Source: Ambulatory Visit | Attending: Family Medicine | Admitting: Family Medicine

## 2022-06-09 ENCOUNTER — Encounter: Payer: Self-pay | Admitting: Family Medicine

## 2022-06-09 ENCOUNTER — Ambulatory Visit (INDEPENDENT_AMBULATORY_CARE_PROVIDER_SITE_OTHER): Payer: 59 | Admitting: Family Medicine

## 2022-06-09 VITALS — BP 117/80 | HR 76 | Ht 63.0 in | Wt 221.0 lb

## 2022-06-09 DIAGNOSIS — Z124 Encounter for screening for malignant neoplasm of cervix: Secondary | ICD-10-CM | POA: Diagnosis not present

## 2022-06-09 DIAGNOSIS — R7301 Impaired fasting glucose: Secondary | ICD-10-CM

## 2022-06-09 DIAGNOSIS — E559 Vitamin D deficiency, unspecified: Secondary | ICD-10-CM | POA: Diagnosis not present

## 2022-06-09 MED ORDER — VITAMIN D (ERGOCALCIFEROL) 1.25 MG (50000 UNIT) PO CAPS: 50000.0000 [IU] | ORAL_CAPSULE | ORAL | 2 refills | Status: DC

## 2022-06-09 NOTE — Progress Notes (Signed)
Established Patient Office Visit  Subjective:  Patient ID: Carol Hunt, female    DOB: [birth year]  Age: 31 y.o. MRN: [id number]  CC:  Chief Complaint  Patient presents with   Follow-up    Follow up appt, will have pap smear today.     HPI Carol Hunt is a 31 y.o. female with past medical history of Telangiectasis of the lower extremities presents for f/u of  chronic medical conditions and a pap smear.  Past Medical History:  Diagnosis Date   Gestational diabetes    metformin and glyburide   Medical history non-contributory    Pregnant     Past Surgical History:  Procedure Laterality Date   MIDDLE EAR SURGERY     TYMPANOPLASTY      Family History  Problem Relation Age of Onset   Pancreatitis Father        living   Diabetes Father    Diabetes Sister    Cancer Maternal Aunt        breast    Diabetes Paternal Aunt    COPD Paternal Grandfather    Colon cancer Neg Hx     Social History   Socioeconomic History   Marital status: Single    Spouse name: Not on file   Number of children: 1   Years of education: Not on file   Highest education level: Not on file  Occupational History   Not on file  Tobacco Use   Smoking status: Every Day    Types: E-cigarettes    Passive exposure: Never   Smokeless tobacco: Never  Vaping Use   Vaping Use: Never used  Substance and Sexual Activity   Alcohol use: No   Drug use: No   Sexual activity: Yes    Birth control/protection: None  Other Topics Concern   Not on file  Social History Narrative   Not on file   Social Determinants of Health   Financial Resource Strain: Low Risk  (08/18/2018)   Overall Financial Resource Strain (CARDIA)    Difficulty of Paying Living Expenses: Not hard at all  Food Insecurity: No Food Insecurity (08/18/2018)   Hunger Vital Sign    Worried About Running Out of Food in the Last Year: Never true    [place] in the Last Year: Never true  Transportation Needs: Unknown  (08/18/2018)   PRAPARE - Hydrologist (Medical): No    Lack of Transportation (Non-Medical): Not on file  Physical Activity: Not on file  Stress: No Stress Concern Present (08/18/2018)   [place]    Feeling of Stress : Not at all  Social Connections: Not on file  Intimate Partner Violence: Not At Risk (08/18/2018)   Humiliation, Afraid, Rape, and Kick questionnaire    Fear of Current or Ex-Partner: No    Emotionally Abused: No    Physically Abused: No    Sexually Abused: No    Outpatient Medications Prior to Visit  Medication Sig Dispense Refill   IUD'S IU by Intrauterine route.     Vitamin D, Ergocalciferol, (DRISDOL) 1.25 MG (50000 UNIT) CAPS capsule Take 1 capsule (50,000 Units total) by mouth every 7 (seven) days. 5 capsule 1   trimethoprim-polymyxin b (POLYTRIM) ophthalmic solution Place 1 drop into the left eye every 4 (four) hours. 10 mL 0   No facility-administered medications prior to visit.    No Known Allergies  ROS  Review of Systems  Constitutional:  Negative for chills, fatigue and fever.  Genitourinary:  Negative for decreased urine volume, vaginal bleeding, vaginal discharge and vaginal pain.  Psychiatric/Behavioral:  Negative for self-injury and suicidal ideas.       Objective:    Physical Exam HENT:     Head: Normocephalic.  Cardiovascular:     Rate and Rhythm: Normal rate and regular rhythm.     Pulses: Normal pulses.     Heart sounds: Normal heart sounds.  Pulmonary:     Effort: Pulmonary effort is normal.     Breath sounds: Normal breath sounds.  Genitourinary:    Exam position: Lithotomy position.     Pubic Area: No rash or pubic lice.      Tanner stage (genital): 5.     Vagina: No vaginal discharge.     Comments: Vaginal wall: pink and rugated, smooth and non-tender; absence of lesions, edema, and erythema. Labia Majora and Minora: present  bilaterally, moist, soft tissue, and homogeneous; free of edema and ulcerations. Clitoris is anatomically present, above the urethral, and free of lesions, masses, and ulceration.    Neurological:     Mental Status: She is alert.     BP 117/80   Pulse 76   Ht '5\' 3"'  (1.6 m)   Wt 221 lb (100.2 kg)   LMP 05/18/2022   SpO2 97%   BMI 39.15 kg/m  Wt Readings from Last 3 Encounters:  06/09/22 221 lb (100.2 kg)  02/26/22 224 lb (101.6 kg)  10/05/21 230 lb (104.3 kg)    Lab Results  Component Value Date   TSH 1.370 02/26/2022   Lab Results  Component Value Date   WBC 7.3 02/26/2022   HGB 11.4 02/26/2022   HCT 35.0 02/26/2022   MCV 87 02/26/2022   PLT 283 02/26/2022   Lab Results  Component Value Date   NA 140 02/26/2022   K 4.1 02/26/2022   CO2 22 02/26/2022   GLUCOSE 102 (H) 02/26/2022   BUN 8 02/26/2022   CREATININE 0.77 02/26/2022   BILITOT 0.3 02/26/2022   ALKPHOS 79 02/26/2022   AST 36 02/26/2022   ALT 42 (H) 02/26/2022   PROT 7.6 02/26/2022   ALBUMIN 4.5 02/26/2022   CALCIUM 9.5 02/26/2022   ANIONGAP 14 10/05/2021   EGFR 107 02/26/2022   Lab Results  Component Value Date   CHOL 214 (H) 02/26/2022   Lab Results  Component Value Date   HDL 35 (L) 02/26/2022   Lab Results  Component Value Date   LDLCALC 152 (H) 02/26/2022   Lab Results  Component Value Date   TRIG 146 02/26/2022   Lab Results  Component Value Date   CHOLHDL 6.1 (H) 02/26/2022   Lab Results  Component Value Date   HGBA1C 6.0 (H) 02/26/2022      Assessment & Plan:   Problem List Items Addressed This Visit       Other   Cervical cancer screening - Primary    - According to the [place], cytology and HPV co-testing (preferred) every 5 years or cytology alone (acceptable) every 3 years. - Pap due 2026       Relevant Orders   Cytology - PAP   Other Visit Diagnoses     Vitamin D deficiency       Relevant Medications   Vitamin D, Ergocalciferol,  (DRISDOL) 1.25 MG (50000 UNIT) CAPS capsule   Other Relevant Orders   Vitamin D (25 hydroxy)  IFG (impaired fasting glucose)       Relevant Orders   CBC with Differential/Platelet   CMP14+EGFR   TSH + free T4   Lipid Profile   Hemoglobin A1C       Meds ordered this encounter  Medications   Vitamin D, Ergocalciferol, (DRISDOL) 1.25 MG (50000 UNIT) CAPS capsule    Sig: Take 1 capsule (50,000 Units total) by mouth every 7 (seven) days.    Dispense:  5 capsule    Refill:  2    Follow-up: Return in about 3 months (around 09/09/2022).    Alvira Monday, FNP

## 2022-06-09 NOTE — Assessment & Plan Note (Signed)
-   According to the [employer], cytology and HPV co-testing (preferred) every 5 years or cytology alone (acceptable) every 3 years. - Pap due 2026  

## 2022-06-09 NOTE — Patient Instructions (Addendum)
I appreciate the opportunity to provide care to you today!    Follow up:  3 months  Labs: please stop by the lab today to get blood drawn (CBC, CMP, TSH, Lipid profile, HgA1c, Vit D)  Please pick up your prescription at the pharmacy   Please continue to a heart-healthy diet and increase your physical activities. Try to exercise for at least three times a week.      It was a pleasure to see you and I look forward to continuing to work together on your health and well-being. Please do not hesitate to call the office if you need care or have questions about your care.   Have a wonderful day and week. With Gratitude, Gilmore Laroche MSN, FNP-BC

## 2022-06-10 ENCOUNTER — Other Ambulatory Visit: Payer: Self-pay | Admitting: Family Medicine

## 2022-06-10 ENCOUNTER — Encounter: Payer: Self-pay | Admitting: Family Medicine

## 2022-06-10 DIAGNOSIS — E559 Vitamin D deficiency, unspecified: Secondary | ICD-10-CM

## 2022-06-10 LAB — CBC WITH DIFFERENTIAL/PLATELET
Basophils Absolute: 0.1 10*3/uL (ref 0.0–0.2)
Basos: 1 %
EOS (ABSOLUTE): 0.4 10*3/uL (ref 0.0–0.4)
Eos: 4 %
Hematocrit: 32.1 % — ABNORMAL LOW (ref 34.0–46.6)
Hemoglobin: 10.4 g/dL — ABNORMAL LOW (ref 11.1–15.9)
Immature Grans (Abs): 0 10*3/uL (ref 0.0–0.1)
Immature Granulocytes: 0 %
Lymphocytes Absolute: 3.3 10*3/uL — ABNORMAL HIGH (ref 0.7–3.1)
Lymphs: 31 %
MCH: 28.2 pg (ref 26.6–33.0)
MCHC: 32.4 g/dL (ref 31.5–35.7)
MCV: 87 fL (ref 79–97)
Monocytes Absolute: 0.5 10*3/uL (ref 0.1–0.9)
Monocytes: 4 %
Neutrophils Absolute: 6.2 10*3/uL (ref 1.4–7.0)
Neutrophils: 60 %
Platelets: 283 10*3/uL (ref 150–450)
RBC: 3.69 x10E6/uL — ABNORMAL LOW (ref 3.77–5.28)
RDW: 12.6 % (ref 11.7–15.4)
WBC: 10.4 10*3/uL (ref 3.4–10.8)

## 2022-06-10 LAB — VITAMIN D 25 HYDROXY (VIT D DEFICIENCY, FRACTURES): Vit D, 25-Hydroxy: 22.1 ng/mL — ABNORMAL LOW (ref 30.0–100.0)

## 2022-06-10 LAB — CMP14+EGFR
ALT: 27 IU/L (ref 0–32)
AST: 27 IU/L (ref 0–40)
Albumin/Globulin Ratio: 1.5 (ref 1.2–2.2)
Albumin: 4.5 g/dL (ref 4.0–5.0)
Alkaline Phosphatase: 69 IU/L (ref 44–121)
BUN/Creatinine Ratio: 13 (ref 9–23)
BUN: 10 mg/dL (ref 6–20)
Bilirubin Total: 0.3 mg/dL (ref 0.0–1.2)
CO2: 21 mmol/L (ref 20–29)
Calcium: 9.2 mg/dL (ref 8.7–10.2)
Chloride: 102 mmol/L (ref 96–106)
Creatinine, Ser: 0.76 mg/dL (ref 0.57–1.00)
Globulin, Total: 3.1 g/dL (ref 1.5–4.5)
Glucose: 77 mg/dL (ref 70–99)
Potassium: 3.7 mmol/L (ref 3.5–5.2)
Sodium: 138 mmol/L (ref 134–144)
Total Protein: 7.6 g/dL (ref 6.0–8.5)
eGFR: 108 mL/min/{1.73_m2} (ref >59)

## 2022-06-10 LAB — TSH+FREE T4
Free T4: 1.11 ng/dL (ref 0.82–1.77)
TSH: 1.15 u[IU]/mL (ref 0.450–4.500)

## 2022-06-10 LAB — LIPID PANEL
Chol/HDL Ratio: 5.3 ratio — ABNORMAL HIGH (ref 0.0–4.4)
Cholesterol, Total: 185 mg/dL (ref 100–199)
HDL: 35 mg/dL — ABNORMAL LOW (ref >39)
LDL Chol Calc (NIH): 126 mg/dL — ABNORMAL HIGH (ref 0–99)
Triglycerides: 134 mg/dL (ref 0–149)
VLDL Cholesterol Cal: 24 mg/dL (ref 5–40)

## 2022-06-10 LAB — HEMOGLOBIN A1C
Est. average glucose Bld gHb Est-mCnc: 128 mg/dL
Hgb A1c MFr Bld: 6.1 % — ABNORMAL HIGH (ref 4.8–5.6)

## 2022-06-10 MED ORDER — VITAMIN D (ERGOCALCIFEROL) 1.25 MG (50000 UNIT) PO CAPS: 50000.0000 [IU] | ORAL_CAPSULE | ORAL | 2 refills | Status: AC

## 2022-06-10 NOTE — Progress Notes (Signed)
Please inform the patient that she has prediabetes. I recommend walking at least 3 times daily for 30 minutes and decreasing her intake of foods high in sugar.   Informed her to decrease her intake of foods high in saturated fats and cholesterol, like steak, beef roast, ribs, pork chop, ground beef, butter, cheese, and ice cream.  Her cholesterol is elevated. I want her LDL<100.  I've sent a refill of her Vit D to continue taking.

## 2022-06-16 LAB — CYTOLOGY - PAP
Chlamydia: NEGATIVE
Comment: NEGATIVE
Comment: NEGATIVE
Comment: NEGATIVE
Comment: NORMAL
Diagnosis: NEGATIVE
High risk HPV: NEGATIVE
Neisseria Gonorrhea: NEGATIVE
Trichomonas: NEGATIVE

## 2022-06-16 NOTE — Progress Notes (Signed)
Please inform the patient that her pap was negative. Her gonorrhea,  chlamydia and trichomoniasis results were negative

## 2022-09-09 ENCOUNTER — Ambulatory Visit (INDEPENDENT_AMBULATORY_CARE_PROVIDER_SITE_OTHER): Payer: 59 | Admitting: Family Medicine

## 2022-09-09 ENCOUNTER — Encounter: Payer: Self-pay | Admitting: Family Medicine

## 2022-09-09 VITALS — BP 114/64 | HR 63 | Ht 63.0 in | Wt 206.1 lb

## 2022-09-09 DIAGNOSIS — E559 Vitamin D deficiency, unspecified: Secondary | ICD-10-CM

## 2022-09-09 DIAGNOSIS — E7849 Other hyperlipidemia: Secondary | ICD-10-CM

## 2022-09-09 DIAGNOSIS — Z9889 Other specified postprocedural states: Secondary | ICD-10-CM

## 2022-09-09 DIAGNOSIS — R7301 Impaired fasting glucose: Secondary | ICD-10-CM | POA: Diagnosis not present

## 2022-09-09 DIAGNOSIS — H6691 Otitis media, unspecified, right ear: Secondary | ICD-10-CM | POA: Diagnosis not present

## 2022-09-09 DIAGNOSIS — R7303 Prediabetes: Secondary | ICD-10-CM

## 2022-09-09 DIAGNOSIS — E038 Other specified hypothyroidism: Secondary | ICD-10-CM

## 2022-09-09 MED ORDER — AMOXICILLIN-POT CLAVULANATE 875-125 MG PO TABS: 1.0000 | ORAL_TABLET | Freq: Two times a day (BID) | ORAL | 0 refills | Status: AC

## 2022-09-09 NOTE — Assessment & Plan Note (Signed)
Given physical semination findings, will treat patient with Augmentin for 5 days Referral placed to ENT for further evaluation, given patient history of bilateral tympanoplasty

## 2022-09-09 NOTE — Progress Notes (Signed)
Established Patient Office Visit  Subjective:  Patient ID: Carol Hunt, female    DOB: 04-Oct-1991  Age: 31 y.o. MRN: [id number]  CC:  Chief Complaint  Patient presents with   Follow-up    3 month f/u pt would like to discuss right ear problem has difficulty hearing at times.     HPI Carol Hunt is a 31 y.o. female with past medical history of anemia, rectal bleeding, and foreign body of the forearm presents for f/u of  chronic medical conditions.  Right ear problem: She reports a history of bilateral tympanoplasty when she was 31 years old.  She reports difficulty hearing in the right ear with mild pain.  She denies foreign body, drainage, and odor from the right ear.  No recent injury or trauma to the affected ear reported.  No fever or chills reported.  Past Medical History:  Diagnosis Date   Gestational diabetes    metformin and glyburide   Medical history non-contributory    Pregnant     Past Surgical History:  Procedure Laterality Date   MIDDLE EAR SURGERY     TYMPANOPLASTY      Family History  Problem Relation Age of Onset   Pancreatitis Father        living   Diabetes Father    Diabetes Sister    Cancer Maternal Aunt        breast    Diabetes Paternal Aunt    COPD Paternal Grandfather    Colon cancer Neg Hx     Social History   Socioeconomic History   Marital status: Single    Spouse name: Not on file   Number of children: 1   Years of education: Not on file   Highest education level: Not on file  Occupational History   Not on file  Tobacco Use   Smoking status: Every Day    Types: E-cigarettes    Passive exposure: Never   Smokeless tobacco: Never  Vaping Use   Vaping Use: Never used  Substance and Sexual Activity   Alcohol use: No   Drug use: No   Sexual activity: Yes    Birth control/protection: None  Other Topics Concern   Not on file  Social History Narrative   Not on file   Social Determinants of Health   Financial Resource  Strain: Low Risk  (08/18/2018)   Overall Financial Resource Strain (CARDIA)    Difficulty of Paying Living Expenses: Not hard at all  Food Insecurity: No Food Insecurity (08/18/2018)   Hunger Vital Sign    Worried About Running Out of Food in the Last Year: Never true    [place] in the Last Year: Never true  Transportation Needs: Unknown (08/18/2018)   PRAPARE - Hydrologist (Medical): No    Lack of Transportation (Non-Medical): Not on file  Physical Activity: Not on file  Stress: No Stress Concern Present (08/18/2018)   [place]    Feeling of Stress : Not at all  Social Connections: Not on file  Intimate Partner Violence: Not At Risk (08/18/2018)   Humiliation, Afraid, Rape, and Kick questionnaire    Fear of Current or Ex-Partner: No    Emotionally Abused: No    Physically Abused: No    Sexually Abused: No    Outpatient Medications Prior to Visit  Medication Sig Dispense Refill   IUD'S IU by Intrauterine route.  Vitamin D, Ergocalciferol, (DRISDOL) 1.25 MG (50000 UNIT) CAPS capsule Take 1 capsule (50,000 Units total) by mouth every 7 (seven) days. 5 capsule 2   trimethoprim-polymyxin b (POLYTRIM) ophthalmic solution Place 1 drop into the left eye every 4 (four) hours. 10 mL 0   No facility-administered medications prior to visit.    No Known Allergies  ROS Review of Systems  Constitutional:  Negative for fatigue and fever.  HENT:  Positive for ear pain. Negative for ear discharge.   Respiratory:  Negative for chest tightness and shortness of breath.   Cardiovascular:  Negative for chest pain and palpitations.  Neurological:  Negative for dizziness and headaches.      Objective:    Physical Exam HENT:     Right Ear: Decreased hearing noted. No drainage or tenderness. There is no impacted cerumen. No foreign body. Tympanic membrane is bulging. Tympanic membrane is not  injected, erythematous or retracted.     BP 114/64   Pulse 63   Ht _0  (1.6 m)   Wt 206 lb 1.3 oz (93.5 kg)   SpO2 99%   BMI 36.51 kg/m  Wt Readings from Last 3 Encounters:  09/09/22 206 lb 1.3 oz (93.5 kg)  06/09/22 221 lb (100.2 kg)  02/26/22 224 lb (101.6 kg)    Lab Results  Component Value Date   TSH 1.150 06/09/2022   Lab Results  Component Value Date   WBC 10.4 06/09/2022   HGB 10.4 (L) 06/09/2022   HCT 32.1 (L) 06/09/2022   MCV 87 06/09/2022   PLT 283 06/09/2022   Lab Results  Component Value Date   NA 138 06/09/2022   K 3.7 06/09/2022   CO2 21 06/09/2022   GLUCOSE 77 06/09/2022   BUN 10 06/09/2022   CREATININE 0.76 06/09/2022   BILITOT 0.3 06/09/2022   ALKPHOS 69 06/09/2022   AST 27 06/09/2022   ALT 27 06/09/2022   PROT 7.6 06/09/2022   ALBUMIN 4.5 06/09/2022   CALCIUM 9.2 06/09/2022   ANIONGAP 14 10/05/2021   EGFR 108 06/09/2022   Lab Results  Component Value Date   CHOL 185 06/09/2022   Lab Results  Component Value Date   HDL 35 (L) 06/09/2022   Lab Results  Component Value Date   LDLCALC 126 (H) 06/09/2022   Lab Results  Component Value Date   TRIG 134 06/09/2022   Lab Results  Component Value Date   CHOLHDL 5.3 (H) 06/09/2022   Lab Results  Component Value Date   HGBA1C 6.1 (H) 06/09/2022      Assessment & Plan:  Right otitis media, unspecified otitis media type Assessment & Plan: Given physical semination findings, will treat patient with Augmentin for 5 days Referral placed to ENT for further evaluation, given patient history of bilateral tympanoplasty  Orders: -     Amoxicillin-Pot Clavulanate; Take 1 tablet by mouth 2 (two) times daily for 5 days.  Dispense: 10 tablet; Refill: 0  History of bilateral tympanoplasty -     Ambulatory referral to ENT  Vitamin D deficiency -     VITAMIN D 25 Hydroxy (Vit-D Deficiency, Fractures)  Prediabetes  IFG (impaired fasting glucose) -     Hemoglobin A1c  Other specified  hypothyroidism -     TSH + free T4  Other hyperlipidemia -     Lipid panel -     CMP14+EGFR -     CBC with Differential/Platelet    Follow-up: No follow-ups on file.   Peter Congo  Louanna Raw, FNP

## 2022-09-09 NOTE — Patient Instructions (Addendum)
I appreciate the opportunity to provide care to you today!    Follow up:  4 months  Labs: please stop by the lab today to get your blood drawn (CBC, CMP, TSH, Lipid profile, HgA1c, Vit D)  Please pick up your antibiotic at the pharmacy and complete therapy                        Congratulations on your weight loss and taking excellent care of yourself, I am so proud of you!  Referrals today- ENT   Please continue to a heart-healthy diet and increase your physical activities. Try to exercise for at least three times a week.      It was a pleasure to see you and I look forward to continuing to work together on your health and well-being. Please do not hesitate to call the office if you need care or have questions about your care.   Have a wonderful day and week. With Gratitude, Gilmore Laroche MSN, FNP-BC

## 2022-09-10 LAB — CMP14+EGFR
ALT: 12 IU/L (ref 0–32)
AST: 12 IU/L (ref 0–40)
Albumin/Globulin Ratio: 1.5 (ref 1.2–2.2)
Albumin: 4.5 g/dL (ref 4.0–5.0)
Alkaline Phosphatase: 71 IU/L (ref 44–121)
BUN/Creatinine Ratio: 12 (ref 9–23)
BUN: 8 mg/dL (ref 6–20)
Bilirubin Total: 0.2 mg/dL (ref 0.0–1.2)
CO2: 23 mmol/L (ref 20–29)
Calcium: 9.5 mg/dL (ref 8.7–10.2)
Chloride: 102 mmol/L (ref 96–106)
Creatinine, Ser: 0.67 mg/dL (ref 0.57–1.00)
Globulin, Total: 3.1 g/dL (ref 1.5–4.5)
Glucose: 77 mg/dL (ref 70–99)
Potassium: 3.7 mmol/L (ref 3.5–5.2)
Sodium: 140 mmol/L (ref 134–144)
Total Protein: 7.6 g/dL (ref 6.0–8.5)
eGFR: 121 mL/min/{1.73_m2} (ref >59)

## 2022-09-10 LAB — LIPID PANEL
Chol/HDL Ratio: 4.4 ratio (ref 0.0–4.4)
Cholesterol, Total: 189 mg/dL (ref 100–199)
HDL: 43 mg/dL (ref >39)
LDL Chol Calc (NIH): 126 mg/dL — ABNORMAL HIGH (ref 0–99)
Triglycerides: 112 mg/dL (ref 0–149)
VLDL Cholesterol Cal: 20 mg/dL (ref 5–40)

## 2022-09-10 LAB — TSH+FREE T4
Free T4: 1.04 ng/dL (ref 0.82–1.77)
TSH: 1.93 u[IU]/mL (ref 0.450–4.500)

## 2022-09-10 LAB — CBC WITH DIFFERENTIAL/PLATELET
Basophils Absolute: 0.1 10*3/uL (ref 0.0–0.2)
Basos: 1 %
EOS (ABSOLUTE): 0.3 10*3/uL (ref 0.0–0.4)
Eos: 2 %
Hematocrit: 33.2 % — ABNORMAL LOW (ref 34.0–46.6)
Hemoglobin: 10.8 g/dL — ABNORMAL LOW (ref 11.1–15.9)
Immature Grans (Abs): 0 10*3/uL (ref 0.0–0.1)
Immature Granulocytes: 0 %
Lymphocytes Absolute: 3.6 10*3/uL — ABNORMAL HIGH (ref 0.7–3.1)
Lymphs: 31 %
MCH: 27.7 pg (ref 26.6–33.0)
MCHC: 32.5 g/dL (ref 31.5–35.7)
MCV: 85 fL (ref 79–97)
Monocytes Absolute: 0.5 10*3/uL (ref 0.1–0.9)
Monocytes: 4 %
Neutrophils Absolute: 7 10*3/uL (ref 1.4–7.0)
Neutrophils: 62 %
Platelets: 264 10*3/uL (ref 150–450)
RBC: 3.9 x10E6/uL (ref 3.77–5.28)
RDW: 12.3 % (ref 11.7–15.4)
WBC: 11.4 10*3/uL — ABNORMAL HIGH (ref 3.4–10.8)

## 2022-09-10 LAB — HEMOGLOBIN A1C
Est. average glucose Bld gHb Est-mCnc: 123 mg/dL
Hgb A1c MFr Bld: 5.9 % — ABNORMAL HIGH (ref 4.8–5.6)

## 2022-09-10 LAB — VITAMIN D 25 HYDROXY (VIT D DEFICIENCY, FRACTURES): Vit D, 25-Hydroxy: 29.5 ng/mL — ABNORMAL LOW (ref 30.0–100.0)

## 2022-10-21 ENCOUNTER — Other Ambulatory Visit: Payer: Self-pay

## 2022-10-21 DIAGNOSIS — R7303 Prediabetes: Secondary | ICD-10-CM | POA: Insufficient documentation

## 2022-12-13 ENCOUNTER — Encounter: Payer: Self-pay | Admitting: Emergency Medicine

## 2022-12-13 ENCOUNTER — Ambulatory Visit
Admission: EM | Admit: 2022-12-13 | Discharge: 2022-12-13 | Disposition: A | Discharge status: Home / Self Care | Payer: 59 | Attending: Nurse Practitioner | Admitting: Nurse Practitioner

## 2022-12-13 DIAGNOSIS — J020 Streptococcal pharyngitis: Secondary | ICD-10-CM

## 2022-12-13 LAB — POCT RAPID STREP A (OFFICE): Rapid Strep A Screen: POSITIVE — AB

## 2022-12-13 MED ORDER — ACETAMINOPHEN 500 MG PO TABS
1000.0000 mg | ORAL_TABLET | Freq: Once | ORAL | Status: AC
  Administered 2022-12-13: 1000 mg via ORAL

## 2022-12-13 MED ORDER — AMOXICILLIN 500 MG PO CAPS: 500.0000 mg | ORAL_CAPSULE | Freq: Two times a day (BID) | ORAL | 0 refills | Status: AC

## 2022-12-13 NOTE — Discharge Instructions (Addendum)
Take medication as prescribed.  Please make sure you complete the antibiotic, even if you begin feeling better. Increase fluids and allow for plenty of rest. Recommend over-the-counter Tylenol or Ibuprofen as needed for pain, fever, or general discomfort. Warm salt water gargles 3-4 times daily to help with throat pain or discomfort. Recommend a diet with soft foods to include soups, broths, puddings, yogurt, Jell-O's, or popsicles until symptoms improve. Change toothbrush after 3 days. Follow-up if symptoms do not improve.

## 2022-12-13 NOTE — ED Triage Notes (Signed)
Sore throat since Friday.  Fever and fatigue.

## 2022-12-13 NOTE — ED Provider Notes (Signed)
[facility]    CSN: [id number] Arrival date & time: 12/13/22  0810      History   Chief Complaint No chief complaint on file.   HPI Carol Hunt is a 32 y.o. female.   The history is provided by the patient.   The patient presents for complaints of fever, chills, sore throat, nasal congestion, runny nose, cough and headache.  Symptoms have been present for the past 2 days.  She denies ear drainage, abdominal pain, nausea, vomiting, or diarrhea.  She reports she has been taking over-the-counter cough and cold medications for her symptoms.  She states she has been around her boss who has been sick. Past Medical History:  Diagnosis Date   Gestational diabetes    metformin and glyburide   Medical history non-contributory    Pregnant     Patient Active Problem List   Diagnosis Date Noted   Prediabetes 10/21/2022   Right otitis media 09/09/2022   Cervical cancer screening 06/09/2022   Telangiectasia of extremity 02/26/2022   Acute URI 02/26/2022   Encounter for IUD insertion 10/25/2018   History of gestational diabetes 06/07/2018   History of chlamydia 02/14/2018   Foreign body of forearm, superficial 12/05/2012   Rectal bleeding 01/21/2011   Anemia 01/21/2011    Past Surgical History:  Procedure Laterality Date   MIDDLE EAR SURGERY     TYMPANOPLASTY      OB History     Gravida  1   Para  1   Term  1   Preterm      AB      Living  1      SAB      IAB      Ectopic      Multiple  0   Live Births  1            Home Medications    Prior to Admission medications   Medication Sig Start Date End Date Taking? Authorizing Provider  amoxicillin (AMOXIL) 500 MG capsule Take 1 capsule (500 mg total) by mouth 2 (two) times daily for 11 days. 12/13/22 12/24/22 Yes Marwan Lipe-Warren, Alda Lea, NP  IUD'S IU by Intrauterine route.    [provider]  trimethoprim-polymyxin b (POLYTRIM) ophthalmic solution Place 1 drop into the left eye  every 4 (four) hours. 12/12/21   Mar Daring, PA-C  Vitamin D, Ergocalciferol, (DRISDOL) 1.25 MG (50000 UNIT) CAPS capsule Take 1 capsule (50,000 Units total) by mouth every 7 (seven) days. 06/10/22   Alvira Monday, FNP    Family History Family History  Problem Relation Age of Onset   Pancreatitis Father        living   Diabetes Father    Diabetes Sister    Cancer Maternal Aunt        breast    Diabetes Paternal Aunt    COPD Paternal Grandfather    Colon cancer Neg Hx     Social History Social History   Tobacco Use   Smoking status: Every Day    Types: E-cigarettes    Passive exposure: Never   Smokeless tobacco: Never  Vaping Use   Vaping Use: Never used  Substance Use Topics   Alcohol use: No   Drug use: No     Allergies   Patient has no known allergies.   Review of Systems Review of Systems Per HPI  Physical Exam Triage Vital Signs ED Triage Vitals  Enc Vitals Group  BP 12/13/22 0815 116/66     Pulse Rate 12/13/22 0815 (!) 108     Resp 12/13/22 0815 18     Temp 12/13/22 0815 (!) 100.4 F (38 C)     Temp Source 12/13/22 0815 Oral     SpO2 12/13/22 0815 97 %     Weight --      Height --      Head Circumference --      Peak Flow --      Pain Score 12/13/22 0816 7     Pain Loc --      Pain Edu? --      Excl. in [place]? --    No data found.  Updated Vital Signs BP 116/66 (BP Location: Right Arm)   Pulse (!) 108   Temp (!) 100.4 F (38 C) (Oral)   Resp 18   LMP 11/30/2022 (Approximate)   SpO2 97%   Visual Acuity Right Eye Distance:   Left Eye Distance:   Bilateral Distance:    Right Eye Near:   Left Eye Near:    Bilateral Near:     Physical Exam Vitals and nursing note reviewed.  Constitutional:      General: She is not in acute distress.    Appearance: Normal appearance. She is well-developed.  HENT:     Head: Normocephalic and atraumatic.     Right Ear: Tympanic membrane, ear canal and external ear normal.     Left Ear:  Tympanic membrane, ear canal and external ear normal.     Nose: Congestion present.     Right Turbinates: Enlarged and swollen.     Left Turbinates: Enlarged and swollen.     Right Sinus: No maxillary sinus tenderness or frontal sinus tenderness.     Left Sinus: No maxillary sinus tenderness or frontal sinus tenderness.     Mouth/Throat:     Lips: Pink.     Mouth: Mucous membranes are moist.     Pharynx: Uvula midline. Pharyngeal swelling, oropharyngeal exudate, posterior oropharyngeal erythema and uvula swelling present.     Tonsils: Tonsillar exudate present. 2+ on the right. 2+ on the left.  Eyes:     Extraocular Movements: Extraocular movements intact.     Conjunctiva/sclera: Conjunctivae normal.     Pupils: Pupils are equal, round, and reactive to light.  Neck:     Thyroid: No thyromegaly.     Trachea: No tracheal deviation.  Cardiovascular:     Rate and Rhythm: Normal rate and regular rhythm.     Pulses: Normal pulses.     Heart sounds: Normal heart sounds.  Pulmonary:     Effort: Pulmonary effort is normal.     Breath sounds: Normal breath sounds.  Abdominal:     General: Bowel sounds are normal. There is no distension.     Palpations: Abdomen is soft.     Tenderness: There is no abdominal tenderness.  Musculoskeletal:     Cervical back: Normal range of motion and neck supple.  Skin:    General: Skin is warm and dry.  Neurological:     General: No focal deficit present.     Mental Status: She is alert and oriented to person, place, and time.  Psychiatric:        Mood and Affect: Mood normal.        Behavior: Behavior normal.        Thought Content: Thought content normal.        Judgment: Judgment normal.  UC Treatments / Results  Labs (all labs ordered are listed, but only abnormal results are displayed) Labs Reviewed  POCT RAPID STREP A (OFFICE) - Abnormal; Notable for the following components:      Result Value   Rapid Strep A Screen Positive (*)     All other components within normal limits    EKG   Radiology No results found.  Procedures Procedures (including critical care time)  Medications Ordered in UC Medications  acetaminophen (TYLENOL) tablet 1,000 mg (1,000 mg Oral Given 12/13/22 0818)    Initial Impression / Assessment and Plan / UC Course  I have reviewed the triage vital signs and the nursing notes.  Pertinent labs & imaging results that were available during my care of the patient were reviewed by me and considered in my medical decision making (see chart for details).  The patient presents afebrile and tachycardic, she is otherwise in no acute distress.  Rapid strep test was positive.  Will start patient on amoxicillin 500 mg twice daily for the next 10 days.  Supportive care recommendations were provided to the patient to include increasing fluids, allowing for plenty of rest, and use of over-the-counter analgesics for pain or discomfort.  Patient advised to follow-up if symptoms fail to improve.  Patient is agreement with this plan of care and verbalizes understanding.  All questions were answered.  Patient stable for discharge.   Final Clinical Impressions(s) / UC Diagnoses   Final diagnoses:  Streptococcal sore throat     Discharge Instructions      Take medication as prescribed.  Please make sure you complete the antibiotic, even if you begin feeling better. Increase fluids and allow for plenty of rest. Recommend over-the-counter Tylenol or Ibuprofen as needed for pain, fever, or general discomfort. Warm salt water gargles 3-4 times daily to help with throat pain or discomfort. Recommend a diet with soft foods to include soups, broths, puddings, yogurt, Jell-O's, or popsicles until symptoms improve. Change toothbrush after 3 days. Follow-up if symptoms do not improve.      ED Prescriptions     Medication Sig Dispense Auth. Provider   amoxicillin (AMOXIL) 500 MG capsule Take 1 capsule (500 mg  total) by mouth 2 (two) times daily for 11 days. 21 capsule Odeal Welden-Warren, Alda Lea, NP      PDMP not reviewed this encounter.   Tish Men, NP 12/13/22 [phone]

## 2023-01-06 ENCOUNTER — Encounter: Payer: Self-pay | Admitting: Family Medicine

## 2023-01-06 ENCOUNTER — Ambulatory Visit (INDEPENDENT_AMBULATORY_CARE_PROVIDER_SITE_OTHER): Payer: Self-pay | Admitting: Family Medicine

## 2023-01-06 VITALS — BP 115/65 | HR 76 | Ht 63.0 in | Wt 206.0 lb

## 2023-01-06 DIAGNOSIS — E038 Other specified hypothyroidism: Secondary | ICD-10-CM

## 2023-01-06 DIAGNOSIS — E559 Vitamin D deficiency, unspecified: Secondary | ICD-10-CM

## 2023-01-06 DIAGNOSIS — E7849 Other hyperlipidemia: Secondary | ICD-10-CM

## 2023-01-06 DIAGNOSIS — R200 Anesthesia of skin: Secondary | ICD-10-CM

## 2023-01-06 DIAGNOSIS — R7301 Impaired fasting glucose: Secondary | ICD-10-CM

## 2023-01-06 NOTE — Assessment & Plan Note (Signed)
Reports taking her weekly vitamin D supplement Complains of right hand numbness that has subsided Will assess her vitamin B12 levels today We will continue to monitor

## 2023-01-06 NOTE — Patient Instructions (Addendum)
I appreciate the opportunity to provide care to you today!    Follow up:  5 months  Labs: please stop by the lab today to get your blood drawn (CBC, CMP, TSH, Lipid profile, HgA1c, Vit D and B12)    Please continue to a heart-healthy diet and increase your physical activities. Try to exercise for 58mins at least five days a week.      It was a pleasure to see you and I look forward to continuing to work together on your health and well-being. Please do not hesitate to call the office if you need care or have questions about your care.   Have a wonderful day and week. With Gratitude, Alvira Monday MSN, FNP-BC

## 2023-01-06 NOTE — Progress Notes (Signed)
Established Patient Office Visit  Subjective:  Patient ID: Carol Hunt, female    DOB: 05-Jan-1991  Age: 32 y.o. MRN: [id number]  CC:  Chief Complaint  Patient presents with   Follow-up    4 month f/u, pt reports right hand numbing today only.     HPI Carol Hunt is a 32 y.o. female with past medical history of vitamin D deficiency presents for f/u. For the details of today's visit, please refer to the assessment and plan.     Past Medical History:  Diagnosis Date   Gestational diabetes    metformin and glyburide   Medical history non-contributory    Pregnant     Past Surgical History:  Procedure Laterality Date   MIDDLE EAR SURGERY     TYMPANOPLASTY      Family History  Problem Relation Age of Onset   Pancreatitis Father        living   Diabetes Father    Diabetes Sister    Cancer Maternal Aunt        breast    Diabetes Paternal Aunt    COPD Paternal Grandfather    Colon cancer Neg Hx     Social History   Socioeconomic History   Marital status: Single    Spouse name: Not on file   Number of children: 1   Years of education: Not on file   Highest education level: Not on file  Occupational History   Not on file  Tobacco Use   Smoking status: Every Day    Types: E-cigarettes    Passive exposure: Never   Smokeless tobacco: Never  Vaping Use   Vaping Use: Never used  Substance and Sexual Activity   Alcohol use: No   Drug use: No   Sexual activity: Yes    Birth control/protection: None  Other Topics Concern   Not on file  Social History Narrative   Not on file   Social Determinants of Health   Financial Resource Strain: Low Risk  (08/18/2018)   Overall Financial Resource Strain (CARDIA)    Difficulty of Paying Living Expenses: Not hard at all  Food Insecurity: No Food Insecurity (08/18/2018)   Hunger Vital Sign    Worried About Running Out of Food in the Last Year: Never true    [place] in the Last Year: Never true   Transportation Needs: Unknown (08/18/2018)   PRAPARE - Hydrologist (Medical): No    Lack of Transportation (Non-Medical): Not on file  Physical Activity: Not on file  Stress: No Stress Concern Present (08/18/2018)   [place]    Feeling of Stress : Not at all  Social Connections: Not on file  Intimate Partner Violence: Not At Risk (08/18/2018)   Humiliation, Afraid, Rape, and Kick questionnaire    Fear of Current or Ex-Partner: No    Emotionally Abused: No    Physically Abused: No    Sexually Abused: No    Outpatient Medications Prior to Visit  Medication Sig Dispense Refill   IUD'S IU by Intrauterine route.     Vitamin D, Ergocalciferol, (DRISDOL) 1.25 MG (50000 UNIT) CAPS capsule Take 1 capsule (50,000 Units total) by mouth every 7 (seven) days. 5 capsule 2   trimethoprim-polymyxin b (POLYTRIM) ophthalmic solution Place 1 drop into the left eye every 4 (four) hours. 10 mL 0   No facility-administered medications prior to visit.  No Known Allergies  ROS Review of Systems  Constitutional:  Negative for chills and fever.  Eyes:  Negative for visual disturbance.  Respiratory:  Negative for chest tightness and shortness of breath.   Neurological:  Negative for dizziness and headaches.      Objective:    Physical Exam HENT:     Head: Normocephalic.     Mouth/Throat:     Mouth: Mucous membranes are moist.  Cardiovascular:     Rate and Rhythm: Normal rate.     Heart sounds: Normal heart sounds.  Pulmonary:     Effort: Pulmonary effort is normal.     Breath sounds: Normal breath sounds.  Neurological:     Mental Status: She is alert.     BP 115/65   Pulse 76   Ht 5\' 3"  (1.6 m)   Wt 206 lb 0.6 oz (93.5 kg)   LMP 11/30/2022 (Approximate)   SpO2 98%   BMI 36.50 kg/m  Wt Readings from Last 3 Encounters:  01/06/23 206 lb 0.6 oz (93.5 kg)  09/09/22 206 lb 1.3 oz (93.5  kg)  06/09/22 221 lb (100.2 kg)    Lab Results  Component Value Date   TSH 1.930 09/09/2022   Lab Results  Component Value Date   WBC 11.4 (H) 09/09/2022   HGB 10.8 (L) 09/09/2022   HCT 33.2 (L) 09/09/2022   MCV 85 09/09/2022   PLT 264 09/09/2022   Lab Results  Component Value Date   NA 140 09/09/2022   K 3.7 09/09/2022   CO2 23 09/09/2022   GLUCOSE 77 09/09/2022   BUN 8 09/09/2022   CREATININE 0.67 09/09/2022   BILITOT 0.2 09/09/2022   ALKPHOS 71 09/09/2022   AST 12 09/09/2022   ALT 12 09/09/2022   PROT 7.6 09/09/2022   ALBUMIN 4.5 09/09/2022   CALCIUM 9.5 09/09/2022   ANIONGAP 14 10/05/2021   EGFR 121 09/09/2022   Lab Results  Component Value Date   CHOL 189 09/09/2022   Lab Results  Component Value Date   HDL 43 09/09/2022   Lab Results  Component Value Date   LDLCALC 126 (H) 09/09/2022   Lab Results  Component Value Date   TRIG 112 09/09/2022   Lab Results  Component Value Date   CHOLHDL 4.4 09/09/2022   Lab Results  Component Value Date   HGBA1C 5.9 (H) 09/09/2022      Assessment & Plan:  Vitamin D deficiency Assessment & Plan: Reports taking her weekly vitamin D supplement Complains of right hand numbness that has subsided Will assess her vitamin B12 levels today We will continue to monitor  Orders: -     VITAMIN D 25 Hydroxy (Vit-D Deficiency, Fractures)  Numbness -     Vitamin B12  IFG (impaired fasting glucose) -     Hemoglobin A1c  Other specified hypothyroidism -     TSH + free T4  Other hyperlipidemia -     Lipid panel -     CMP14+EGFR -     CBC with Differential/Platelet    Follow-up: Return in about 5 months (around 06/08/2023).   Alvira Monday, FNP

## 2023-01-08 LAB — CMP14+EGFR
ALT: 10 IU/L (ref 0–32)
AST: 14 IU/L (ref 0–40)
Albumin/Globulin Ratio: 1.5 (ref 1.2–2.2)
Albumin: 4.6 g/dL (ref 4.0–5.0)
Alkaline Phosphatase: 69 IU/L (ref 44–121)
BUN/Creatinine Ratio: 13 (ref 9–23)
BUN: 8 mg/dL (ref 6–20)
Bilirubin Total: 0.3 mg/dL (ref 0.0–1.2)
CO2: 23 mmol/L (ref 20–29)
Calcium: 9.6 mg/dL (ref 8.7–10.2)
Chloride: 99 mmol/L (ref 96–106)
Creatinine, Ser: 0.64 mg/dL (ref 0.57–1.00)
Globulin, Total: 3.1 g/dL (ref 1.5–4.5)
Glucose: 78 mg/dL (ref 70–99)
Potassium: 3.7 mmol/L (ref 3.5–5.2)
Sodium: 137 mmol/L (ref 134–144)
Total Protein: 7.7 g/dL (ref 6.0–8.5)
eGFR: 122 mL/min/{1.73_m2} (ref >59)

## 2023-01-08 LAB — LIPID PANEL
Chol/HDL Ratio: 4.3 ratio (ref 0.0–4.4)
Cholesterol, Total: 210 mg/dL — ABNORMAL HIGH (ref 100–199)
HDL: 49 mg/dL (ref >39)
LDL Chol Calc (NIH): 142 mg/dL — ABNORMAL HIGH (ref 0–99)
Triglycerides: 108 mg/dL (ref 0–149)
VLDL Cholesterol Cal: 19 mg/dL (ref 5–40)

## 2023-01-08 LAB — HEMOGLOBIN A1C
Est. average glucose Bld gHb Est-mCnc: 126 mg/dL
Hgb A1c MFr Bld: 6 % — ABNORMAL HIGH (ref 4.8–5.6)

## 2023-01-08 LAB — CBC WITH DIFFERENTIAL/PLATELET
Basophils Absolute: 0.1 10*3/uL (ref 0.0–0.2)
Basos: 1 %
EOS (ABSOLUTE): 0.3 10*3/uL (ref 0.0–0.4)
Eos: 3 %
Hematocrit: 33.7 % — ABNORMAL LOW (ref 34.0–46.6)
Hemoglobin: 11 g/dL — ABNORMAL LOW (ref 11.1–15.9)
Immature Grans (Abs): 0 10*3/uL (ref 0.0–0.1)
Immature Granulocytes: 0 %
Lymphocytes Absolute: 3.2 10*3/uL — ABNORMAL HIGH (ref 0.7–3.1)
Lymphs: 31 %
MCH: 27.7 pg (ref 26.6–33.0)
MCHC: 32.6 g/dL (ref 31.5–35.7)
MCV: 85 fL (ref 79–97)
Monocytes Absolute: 0.5 10*3/uL (ref 0.1–0.9)
Monocytes: 5 %
Neutrophils Absolute: 6.3 10*3/uL (ref 1.4–7.0)
Neutrophils: 60 %
Platelets: 254 10*3/uL (ref 150–450)
RBC: 3.97 x10E6/uL (ref 3.77–5.28)
RDW: 13.6 % (ref 11.7–15.4)
WBC: 10.4 10*3/uL (ref 3.4–10.8)

## 2023-01-08 LAB — VITAMIN D 25 HYDROXY (VIT D DEFICIENCY, FRACTURES): Vit D, 25-Hydroxy: 25.5 ng/mL — ABNORMAL LOW (ref 30.0–100.0)

## 2023-01-08 LAB — TSH+FREE T4
Free T4: 1.18 ng/dL (ref 0.82–1.77)
TSH: 1.11 u[IU]/mL (ref 0.450–4.500)

## 2023-01-08 LAB — VITAMIN B12: Vitamin B-12: 335 pg/mL (ref 232–1245)

## 2023-01-20 DIAGNOSIS — H90A11 Conductive hearing loss, unilateral, right ear with restricted hearing on the contralateral side: Secondary | ICD-10-CM | POA: Diagnosis not present

## 2023-06-08 ENCOUNTER — Ambulatory Visit: Payer: Medicaid Other | Admitting: Family Medicine

## 2024-01-31 DIAGNOSIS — R059 Cough, unspecified: Secondary | ICD-10-CM | POA: Diagnosis not present

## 2024-01-31 DIAGNOSIS — J069 Acute upper respiratory infection, unspecified: Secondary | ICD-10-CM | POA: Diagnosis not present

## 2024-02-28 DIAGNOSIS — R030 Elevated blood-pressure reading, without diagnosis of hypertension: Secondary | ICD-10-CM | POA: Diagnosis not present

## 2024-02-28 DIAGNOSIS — A084 Viral intestinal infection, unspecified: Secondary | ICD-10-CM | POA: Diagnosis not present

## 2024-02-28 DIAGNOSIS — R112 Nausea with vomiting, unspecified: Secondary | ICD-10-CM | POA: Diagnosis not present

## 2024-02-28 DIAGNOSIS — R197 Diarrhea, unspecified: Secondary | ICD-10-CM | POA: Diagnosis not present
